# Patient Record
Sex: Female | Born: 1955 | Race: White | Hispanic: No | Marital: Married | Smoking: Former smoker
Health system: Southern US, Community
[De-identification: ages and names within clinical notes are randomized; demographics above are authoritative.]

## PROBLEM LIST (undated history)

## (undated) DIAGNOSIS — B019 Varicella without complication: Secondary | ICD-10-CM

## (undated) DIAGNOSIS — G4700 Insomnia, unspecified: Secondary | ICD-10-CM

## (undated) DIAGNOSIS — R351 Nocturia: Secondary | ICD-10-CM

## (undated) DIAGNOSIS — R350 Frequency of micturition: Secondary | ICD-10-CM

## (undated) DIAGNOSIS — G8929 Other chronic pain: Secondary | ICD-10-CM

## (undated) DIAGNOSIS — R3915 Urgency of urination: Secondary | ICD-10-CM

## (undated) DIAGNOSIS — E785 Hyperlipidemia, unspecified: Secondary | ICD-10-CM

## (undated) DIAGNOSIS — R32 Unspecified urinary incontinence: Secondary | ICD-10-CM

## (undated) DIAGNOSIS — M542 Cervicalgia: Secondary | ICD-10-CM

## (undated) DIAGNOSIS — H04123 Dry eye syndrome of bilateral lacrimal glands: Secondary | ICD-10-CM

## (undated) DIAGNOSIS — K219 Gastro-esophageal reflux disease without esophagitis: Secondary | ICD-10-CM

## (undated) DIAGNOSIS — I10 Essential (primary) hypertension: Secondary | ICD-10-CM

## (undated) DIAGNOSIS — M1990 Unspecified osteoarthritis, unspecified site: Secondary | ICD-10-CM

## (undated) DIAGNOSIS — K589 Irritable bowel syndrome without diarrhea: Secondary | ICD-10-CM

## (undated) HISTORY — PX: FOOT SURGERY: SHX648

## (undated) HISTORY — PX: TUBAL LIGATION: SHX77

## (undated) HISTORY — DX: Unspecified urinary incontinence: R32

## (undated) HISTORY — PX: ABDOMINAL HYSTERECTOMY: SHX81

## (undated) HISTORY — PX: ESOPHAGOGASTRODUODENOSCOPY: SHX1529

## (undated) HISTORY — PX: BACK SURGERY: SHX140

## (undated) HISTORY — PX: CARPAL TUNNEL RELEASE: SHX101

## (undated) HISTORY — DX: Varicella without complication: B01.9

## (undated) HISTORY — PX: COLONOSCOPY: SHX174

## (undated) HISTORY — PX: OTHER SURGICAL HISTORY: SHX169

## (undated) HISTORY — PX: LUMBAR LAMINECTOMY: SHX95

---

## 2003-04-15 LAB — HM COLONOSCOPY: HM Colonoscopy: NORMAL

## 2003-04-15 LAB — HM PAP SMEAR

## 2007-04-15 LAB — HM MAMMOGRAPHY

## 2007-10-21 ENCOUNTER — Ambulatory Visit: Payer: Self-pay | Admitting: Specialist

## 2008-09-12 ENCOUNTER — Ambulatory Visit: Payer: Self-pay | Admitting: Internal Medicine

## 2009-11-13 ENCOUNTER — Ambulatory Visit: Payer: Self-pay | Admitting: Internal Medicine

## 2011-11-26 ENCOUNTER — Ambulatory Visit: Payer: Self-pay | Admitting: Family Medicine

## 2012-02-12 ENCOUNTER — Ambulatory Visit: Payer: Self-pay

## 2012-04-14 ENCOUNTER — Encounter: Payer: Self-pay | Admitting: Internal Medicine

## 2012-04-14 ENCOUNTER — Ambulatory Visit (INDEPENDENT_AMBULATORY_CARE_PROVIDER_SITE_OTHER): Payer: BC Managed Care – PPO | Admitting: Internal Medicine

## 2012-04-14 ENCOUNTER — Other Ambulatory Visit: Payer: Self-pay | Admitting: *Deleted

## 2012-04-14 VITALS — BP 110/70 | HR 79 | Temp 98.6°F | Ht 66.0 in | Wt 162.2 lb

## 2012-04-14 DIAGNOSIS — M545 Low back pain, unspecified: Secondary | ICD-10-CM

## 2012-04-14 DIAGNOSIS — R32 Unspecified urinary incontinence: Secondary | ICD-10-CM

## 2012-04-14 DIAGNOSIS — Z0000 Encounter for general adult medical examination without abnormal findings: Secondary | ICD-10-CM

## 2012-04-14 DIAGNOSIS — M542 Cervicalgia: Secondary | ICD-10-CM

## 2012-04-14 DIAGNOSIS — G8929 Other chronic pain: Secondary | ICD-10-CM

## 2012-04-14 DIAGNOSIS — Z1239 Encounter for other screening for malignant neoplasm of breast: Secondary | ICD-10-CM

## 2012-04-14 DIAGNOSIS — E785 Hyperlipidemia, unspecified: Secondary | ICD-10-CM

## 2012-04-14 DIAGNOSIS — H6120 Impacted cerumen, unspecified ear: Secondary | ICD-10-CM

## 2012-04-14 LAB — CBC WITH DIFFERENTIAL/PLATELET
Basophils Relative: 0.7 % (ref 0.0–3.0)
Eosinophils Relative: 3.5 % (ref 0.0–5.0)
HCT: 42.8 % (ref 36.0–46.0)
Lymphs Abs: 1.7 10*3/uL (ref 0.7–4.0)
MCV: 86.2 fl (ref 78.0–100.0)
Monocytes Absolute: 0.5 10*3/uL (ref 0.1–1.0)
Neutro Abs: 4 10*3/uL (ref 1.4–7.7)
RBC: 4.97 Mil/uL (ref 3.87–5.11)
WBC: 6.4 10*3/uL (ref 4.5–10.5)

## 2012-04-14 LAB — COMPREHENSIVE METABOLIC PANEL
ALT: 18 U/L (ref 0–35)
AST: 23 U/L (ref 0–37)
Albumin: 4.3 g/dL (ref 3.5–5.2)
BUN: 13 mg/dL (ref 6–23)
CO2: 28 mEq/L (ref 19–32)
Calcium: 9.6 mg/dL (ref 8.4–10.5)
Chloride: 104 mEq/L (ref 96–112)
GFR: 82.88 mL/min (ref >60.00)
Potassium: 4.5 mEq/L (ref 3.5–5.1)

## 2012-04-14 MED ORDER — ATORVASTATIN CALCIUM 20 MG PO TABS: 20.0000 mg | ORAL_TABLET | Freq: Every day | ORAL | Status: DC

## 2012-04-14 NOTE — Assessment & Plan Note (Signed)
Symptoms of urinary incontinence and decrease sensation within the perineal region. Question if this may be secondary to nerve injury during lumbar laminectomy. Symptoms of urinary incontinence fairly well-controlled with oxybutynin. Will continue. We discussed potential referral to urogynecology in the future if symptoms are worsening.

## 2012-04-14 NOTE — Progress Notes (Signed)
Subjective:    Patient ID: Melissa Patton, female    DOB: 1956-02-06, 56 y.o.   MRN: [id number]  HPI 56YO female with h/o lumbar laminectomy x 2 and chronic low back pain presents to establish care. Her primary concern today is several week h/o neck pain and stiffness. Denies known injury.  Symptoms of pain severe in upper back/neck with radiation to bilateral arms. Notes bilateral arm numbness and tingling in hands. Occasional weakness bilateral hands. 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 Currently on medical leave from work.  Has appointment scheduled with orthopedics next week for evaluation. Notes she had a CT cervical spine in past ordered by her previous PCP, but unsure results. Has not had neurosurgical evaluation of cervical spine pain. Currently using Etodolac and Robaxin with minimal improvement in symptoms. Has also used tramadol with no improvement. Prefers not to use narcotics because of sedation.   Outpatient Encounter Prescriptions as of 04/14/2012  Medication Sig Dispense Refill  . etodolac (LODINE) 500 MG tablet Take 500 mg by mouth 2 (two) times daily.      . methocarbamol (ROBAXIN) 500 MG tablet Take 500 mg by mouth 2 (two) times daily.      . Misc Natural Products (OSTEO BI-FLEX ADV DOUBLE ST PO) Take 1 tablet by mouth daily.      Marland Kitchen OVER THE COUNTER MEDICATION I-cool Take one tablet daily      . oxybutynin (OXYTROL) 3.9 MG/24HR Place 1 patch onto the skin 2 (two) times a week.      . traMADol (ULTRAM) 50 MG tablet Take 50 mg by mouth daily.      . vitamin B-12 (CYANOCOBALAMIN) 1000 MCG tablet Take 1,000 mcg by mouth daily.      . vitamin E 400 UNIT capsule Take 400 Units by mouth daily.       BP 110/70  Pulse 79  Temp 98.6 F (37 C) (Oral)  Ht 5\' 6"  (1.676 m)  Wt 162 lb 4 oz (73.596 kg)  BMI 26.19 kg/m2  SpO2 95%  Review of Systems  Constitutional: Negative for fever, chills, appetite change, fatigue and unexpected  weight change.  HENT: Positive for neck pain. Negative for ear pain, congestion, sore throat, trouble swallowing, voice change and sinus pressure.   Eyes: Negative for visual disturbance.  Respiratory: Negative for cough, shortness of breath, wheezing and stridor.   Cardiovascular: Negative for chest pain, palpitations and leg swelling.  Gastrointestinal: Negative for nausea, vomiting, abdominal pain, diarrhea, constipation, blood in stool, abdominal distention and anal bleeding.  Genitourinary: Negative for dysuria and flank pain.  Musculoskeletal: Positive for myalgias and arthralgias. Negative for gait problem.  Skin: Negative for color change and rash.  Neurological: Positive for weakness and numbness. Negative for dizziness and headaches.  Hematological: Negative for adenopathy. Does not bruise/bleed easily.  Psychiatric/Behavioral: Positive for disturbed wake/sleep cycle. Negative for suicidal ideas and dysphoric mood. The patient is not nervous/anxious.        Objective:   Physical Exam  Constitutional: She is oriented to person, place, and time. She appears well-developed and well-nourished. No distress.  HENT:  Head: Normocephalic and atraumatic.  Right Ear: External ear normal.  Left Ear: External ear normal.  Nose: Nose normal.  Mouth/Throat: Oropharynx is clear and moist. No oropharyngeal exudate.  Eyes: Conjunctivae normal are normal. Pupils are equal, round, and reactive to light. Right eye exhibits no discharge. Left eye exhibits no discharge. No scleral icterus.  Neck: Normal range of motion. Neck supple. No tracheal deviation present. No thyromegaly present.  Cardiovascular: Normal rate, regular rhythm, normal heart sounds and intact distal pulses.  Exam reveals no gallop and no friction rub.   No murmur heard. Pulmonary/Chest: Effort normal and breath sounds normal. No respiratory distress. She has no wheezes. She has no rales. She exhibits no tenderness.    Musculoskeletal: She exhibits no edema and no tenderness.       Cervical back: She exhibits decreased range of motion, pain and spasm.       Lumbar back: She exhibits decreased range of motion and pain.  Lymphadenopathy:    She has no cervical adenopathy.  Neurological: She is alert and oriented to person, place, and time. No cranial nerve deficit. She exhibits normal muscle tone. Coordination normal.  Skin: Skin is warm and dry. No rash noted. She is not diaphoretic. No erythema. No pallor.  Psychiatric: She has a normal mood and affect. Her behavior is normal. Judgment and thought content normal.          Assessment & Plan:

## 2012-04-14 NOTE — Assessment & Plan Note (Signed)
Bilateral cerumen impaction. Cerumen removed today with warm water lavage. Follow up prn.

## 2012-04-14 NOTE — Assessment & Plan Note (Signed)
Severe upper back pain with bilateral arm weakness and paresthesias. Patient has had CT of the cervical spine for evaluation. Will request records on this. She has followup with orthopedics next week. She will continue nonsteroidal anti-inflammatory medication and muscle relaxants. We discussed addition of narcotic medicine for better pain control but she would prefer to hold off on this. Followup one month.

## 2012-04-14 NOTE — Assessment & Plan Note (Signed)
Cholesterol markedly elevated on labs today. Recommended starting Lipitor 20mg  daily and repeat lipids and LFTs in 1 month.

## 2012-04-14 NOTE — Assessment & Plan Note (Signed)
Chronic low back pain status post lumbar laminectomy x2. Symptoms of low back pain fairly well-controlled with etodolac and Robaxin. Will continue.

## 2012-04-26 ENCOUNTER — Ambulatory Visit: Payer: Self-pay | Admitting: Specialist

## 2012-05-06 ENCOUNTER — Other Ambulatory Visit: Payer: Self-pay | Admitting: Neurosurgery

## 2012-05-07 ENCOUNTER — Encounter (HOSPITAL_COMMUNITY): Payer: Self-pay | Admitting: Pharmacy Technician

## 2012-05-12 ENCOUNTER — Encounter (HOSPITAL_COMMUNITY)
Admission: RE | Admit: 2012-05-12 | Discharge: 2012-05-12 | Disposition: A | Discharge status: Home / Self Care | Payer: BC Managed Care – PPO | Source: Ambulatory Visit | Attending: Neurosurgery | Admitting: Neurosurgery

## 2012-05-12 ENCOUNTER — Encounter (HOSPITAL_COMMUNITY): Payer: Self-pay

## 2012-05-12 HISTORY — DX: Dry eye syndrome of bilateral lacrimal glands: H04.123

## 2012-05-12 HISTORY — DX: Insomnia, unspecified: G47.00

## 2012-05-12 HISTORY — DX: Nocturia: R35.1

## 2012-05-12 HISTORY — DX: Irritable bowel syndrome, unspecified: K58.9

## 2012-05-12 HISTORY — DX: Urgency of urination: R39.15

## 2012-05-12 HISTORY — DX: Unspecified osteoarthritis, unspecified site: M19.90

## 2012-05-12 HISTORY — DX: Cervicalgia: M54.2

## 2012-05-12 HISTORY — DX: Frequency of micturition: R35.0

## 2012-05-12 HISTORY — DX: Other chronic pain: G89.29

## 2012-05-12 HISTORY — DX: Hyperlipidemia, unspecified: E78.5

## 2012-05-12 LAB — BASIC METABOLIC PANEL
Calcium: 9.8 mg/dL (ref 8.4–10.5)
GFR calc Af Amer: >90 mL/min (ref >90)
GFR calc non Af Amer: >90 mL/min (ref >90)
Potassium: 4.2 mEq/L (ref 3.5–5.1)
Sodium: 139 mEq/L (ref 135–145)

## 2012-05-12 LAB — SURGICAL PCR SCREEN
MRSA, PCR: NEGATIVE
Staphylococcus aureus: NEGATIVE

## 2012-05-12 LAB — CBC
Hemoglobin: 13.6 g/dL (ref 12.0–15.0)
MCH: 28.4 pg (ref 26.0–34.0)
Platelets: 307 10*3/uL (ref 150–400)
RBC: 4.79 MIL/uL (ref 3.87–5.11)
WBC: 6.8 10*3/uL (ref 4.0–10.5)

## 2012-05-12 NOTE — Progress Notes (Signed)
Pt doesn't have a cardiologist   Denies ever having a heart cath/echo/stress test  Dr.[name] with [facility] [address] is Medical Md  Denies ekg or cxr within last yr

## 2012-05-12 NOTE — Pre-Procedure Instructions (Signed)
20 ROMONA MURDY  05/12/2012   Your procedure is scheduled on:  Mon, Nov 11 @ 11:08 AM  Report to [facility] at 9:00 AM.  Call this number if you have problems the morning of surgery: [phone]   Remember:   Do not eat food:After Midnight.    Take these medicines the morning of surgery with A SIP OF WATER: Tramadol(Ultram)   Do not wear jewelry, make-up or nail polish.  Do not wear lotions, powders, or perfumes. You may wear deodorant.  Do not shave 48 hours prior to surgery.   Do not bring valuables to the hospital.  Contacts, dentures or bridgework may not be worn into surgery.  Leave suitcase in the car. After surgery it may be brought to your room.  For patients admitted to the hospital, checkout time is 11:00 AM the day of discharge.   Patients discharged the day of surgery will not be allowed to drive home.    Special Instructions: Shower using CHG 2 nights before surgery and the night before surgery.  If you shower the day of surgery use CHG.  Use special wash - you have one bottle of CHG for all showers.  You should use approximately 1/3 of the bottle for each shower.   Please read over the following fact sheets that you were given: Pain Booklet, Coughing and Deep Breathing, MRSA Information and Surgical Site Infection Prevention

## 2012-05-16 MED ORDER — CEFAZOLIN SODIUM-DEXTROSE 2-3 GM-% IV SOLR
2.0000 g | INTRAVENOUS | Status: AC
  Administered 2012-05-17: 2 g via INTRAVENOUS
  Filled 2012-05-16: qty 50

## 2012-05-16 MED ORDER — DEXAMETHASONE SODIUM PHOSPHATE 10 MG/ML IJ SOLN
10.0000 mg | INTRAMUSCULAR | Status: AC
  Administered 2012-05-17: 10 mg via INTRAVENOUS
  Filled 2012-05-16: qty 1

## 2012-05-17 ENCOUNTER — Encounter (HOSPITAL_COMMUNITY): Payer: Self-pay | Admitting: *Deleted

## 2012-05-17 ENCOUNTER — Ambulatory Visit (HOSPITAL_COMMUNITY): Payer: BC Managed Care – PPO

## 2012-05-17 ENCOUNTER — Encounter (HOSPITAL_COMMUNITY)
Admission: RE | Disposition: A | Discharge status: Home / Self Care | Payer: Self-pay | Source: Ambulatory Visit | Attending: Neurosurgery

## 2012-05-17 ENCOUNTER — Ambulatory Visit (HOSPITAL_COMMUNITY): Payer: BC Managed Care – PPO | Admitting: *Deleted

## 2012-05-17 ENCOUNTER — Ambulatory Visit (HOSPITAL_COMMUNITY)
Admission: RE | Admit: 2012-05-17 | Discharge: 2012-05-18 | Disposition: A | Discharge status: Home / Self Care | Payer: BC Managed Care – PPO | Source: Ambulatory Visit | Attending: Neurosurgery | Admitting: Neurosurgery

## 2012-05-17 DIAGNOSIS — E785 Hyperlipidemia, unspecified: Secondary | ICD-10-CM | POA: Insufficient documentation

## 2012-05-17 DIAGNOSIS — Z01812 Encounter for preprocedural laboratory examination: Secondary | ICD-10-CM | POA: Insufficient documentation

## 2012-05-17 DIAGNOSIS — M5020 Other cervical disc displacement, unspecified cervical region: Secondary | ICD-10-CM | POA: Insufficient documentation

## 2012-05-17 DIAGNOSIS — M129 Arthropathy, unspecified: Secondary | ICD-10-CM | POA: Insufficient documentation

## 2012-05-17 DIAGNOSIS — M47812 Spondylosis without myelopathy or radiculopathy, cervical region: Secondary | ICD-10-CM | POA: Insufficient documentation

## 2012-05-17 DIAGNOSIS — M542 Cervicalgia: Secondary | ICD-10-CM

## 2012-05-17 DIAGNOSIS — G8929 Other chronic pain: Secondary | ICD-10-CM

## 2012-05-17 HISTORY — PX: ANTERIOR CERVICAL DECOMP/DISCECTOMY FUSION: SHX1161

## 2012-05-17 SURGERY — ANTERIOR CERVICAL DECOMPRESSION/DISCECTOMY FUSION 3 LEVELS
Anesthesia: General | Site: Spine Cervical | Laterality: Bilateral | Wound class: Clean

## 2012-05-17 MED ORDER — OXYCODONE-ACETAMINOPHEN 5-325 MG PO TABS
1.0000 | ORAL_TABLET | ORAL | Status: DC | PRN
  Administered 2012-05-18: 2 via ORAL
  Filled 2012-05-17: qty 2

## 2012-05-17 MED ORDER — ONDANSETRON HCL 4 MG/2ML IJ SOLN: 4.0000 mg | INTRAMUSCULAR | Status: DC | PRN

## 2012-05-17 MED ORDER — SODIUM CHLORIDE 0.9 % IV SOLN: 250.0000 mL | INTRAVENOUS | Status: DC

## 2012-05-17 MED ORDER — THROMBIN 5000 UNITS EX SOLR
OROMUCOSAL | Status: DC | PRN
  Administered 2012-05-17: 12:00:00 via TOPICAL

## 2012-05-17 MED ORDER — CEFAZOLIN SODIUM 1-5 GM-% IV SOLN
1.0000 g | Freq: Three times a day (TID) | INTRAVENOUS | Status: AC
  Administered 2012-05-17 – 2012-05-18 (×2): 1 g via INTRAVENOUS
  Filled 2012-05-17 (×2): qty 50

## 2012-05-17 MED ORDER — SODIUM CHLORIDE 0.9 % IJ SOLN: 3.0000 mL | INTRAMUSCULAR | Status: DC | PRN

## 2012-05-17 MED ORDER — MEPERIDINE HCL 25 MG/ML IJ SOLN: 6.2500 mg | INTRAMUSCULAR | Status: DC | PRN

## 2012-05-17 MED ORDER — ACETAMINOPHEN 10 MG/ML IV SOLN
INTRAVENOUS | Status: AC
  Administered 2012-05-17: 1000 mg via INTRAVENOUS
  Filled 2012-05-17: qty 100

## 2012-05-17 MED ORDER — OXYCODONE HCL 5 MG/5ML PO SOLN: 5.0000 mg | Freq: Once | ORAL | Status: DC | PRN

## 2012-05-17 MED ORDER — OXYCODONE HCL 5 MG PO TABS: 5.0000 mg | ORAL_TABLET | Freq: Once | ORAL | Status: DC | PRN

## 2012-05-17 MED ORDER — THROMBIN 20000 UNITS EX SOLR
CUTANEOUS | Status: DC | PRN
  Administered 2012-05-17: 12:00:00 via TOPICAL

## 2012-05-17 MED ORDER — HYDROMORPHONE HCL PF 1 MG/ML IJ SOLN
0.2500 mg | INTRAMUSCULAR | Status: DC | PRN
  Administered 2012-05-17 (×2): 0.5 mg via INTRAVENOUS

## 2012-05-17 MED ORDER — HYDROMORPHONE HCL PF 1 MG/ML IJ SOLN: 0.5000 mg | INTRAMUSCULAR | Status: DC | PRN

## 2012-05-17 MED ORDER — GLYCOPYRROLATE 0.2 MG/ML IJ SOLN
INTRAMUSCULAR | Status: DC | PRN
  Administered 2012-05-17: .5 mg via INTRAVENOUS

## 2012-05-17 MED ORDER — FENTANYL CITRATE 0.05 MG/ML IJ SOLN
INTRAMUSCULAR | Status: DC | PRN
  Administered 2012-05-17 (×2): 100 ug via INTRAVENOUS
  Administered 2012-05-17 (×2): 50 ug via INTRAVENOUS
  Administered 2012-05-17: 100 ug via INTRAVENOUS
  Administered 2012-05-17 (×4): 50 ug via INTRAVENOUS

## 2012-05-17 MED ORDER — SODIUM CHLORIDE 0.9 % IJ SOLN
3.0000 mL | Freq: Two times a day (BID) | INTRAMUSCULAR | Status: DC
  Administered 2012-05-17: 3 mL via INTRAVENOUS

## 2012-05-17 MED ORDER — PROPOFOL 10 MG/ML IV BOLUS
INTRAVENOUS | Status: DC | PRN
  Administered 2012-05-17: 150 mg via INTRAVENOUS

## 2012-05-17 MED ORDER — ATORVASTATIN CALCIUM 20 MG PO TABS
20.0000 mg | ORAL_TABLET | Freq: Every day | ORAL | Status: DC
  Administered 2012-05-17: 20 mg via ORAL
  Filled 2012-05-17 (×2): qty 1

## 2012-05-17 MED ORDER — PROMETHAZINE HCL 25 MG/ML IJ SOLN: 6.2500 mg | INTRAMUSCULAR | Status: DC | PRN

## 2012-05-17 MED ORDER — LACTATED RINGERS IV SOLN
INTRAVENOUS | Status: DC | PRN
  Administered 2012-05-17 (×3): via INTRAVENOUS

## 2012-05-17 MED ORDER — BACITRACIN 50000 UNITS IM SOLR
INTRAMUSCULAR | Status: AC
  Filled 2012-05-17: qty 1

## 2012-05-17 MED ORDER — HYPROMELLOSE (GONIOSCOPIC) 2.5 % OP SOLN
1.0000 [drp] | OPHTHALMIC | Status: DC | PRN
  Filled 2012-05-17: qty 15

## 2012-05-17 MED ORDER — MENTHOL 3 MG MT LOZG
1.0000 | LOZENGE | OROMUCOSAL | Status: DC | PRN
  Filled 2012-05-17: qty 9

## 2012-05-17 MED ORDER — PROMETHAZINE HCL 25 MG/ML IJ SOLN
12.5000 mg | Freq: Four times a day (QID) | INTRAMUSCULAR | Status: DC | PRN
  Administered 2012-05-17: 12.5 mg via INTRAVENOUS
  Filled 2012-05-17: qty 1

## 2012-05-17 MED ORDER — ACETAMINOPHEN 650 MG RE SUPP: 650.0000 mg | RECTAL | Status: DC | PRN

## 2012-05-17 MED ORDER — ONDANSETRON HCL 4 MG/2ML IJ SOLN
INTRAMUSCULAR | Status: DC | PRN
  Administered 2012-05-17: 4 mg via INTRAVENOUS

## 2012-05-17 MED ORDER — LIDOCAINE HCL (CARDIAC) 20 MG/ML IV SOLN
INTRAVENOUS | Status: DC | PRN
  Administered 2012-05-17: 80 mg via INTRAVENOUS

## 2012-05-17 MED ORDER — KETOROLAC TROMETHAMINE 30 MG/ML IJ SOLN
15.0000 mg | Freq: Once | INTRAMUSCULAR | Status: AC | PRN
  Administered 2012-05-17: 30 mg via INTRAVENOUS

## 2012-05-17 MED ORDER — CYCLOBENZAPRINE HCL 10 MG PO TABS: 10.0000 mg | ORAL_TABLET | Freq: Three times a day (TID) | ORAL | Status: DC | PRN

## 2012-05-17 MED ORDER — DOCUSATE SODIUM 100 MG PO CAPS
100.0000 mg | ORAL_CAPSULE | Freq: Two times a day (BID) | ORAL | Status: DC
  Administered 2012-05-17: 100 mg via ORAL
  Filled 2012-05-17: qty 1

## 2012-05-17 MED ORDER — ONDANSETRON HCL 4 MG/2ML IJ SOLN
INTRAMUSCULAR | Status: AC
  Administered 2012-05-17: 4 mg
  Filled 2012-05-17: qty 2

## 2012-05-17 MED ORDER — METHOCARBAMOL 500 MG PO TABS
500.0000 mg | ORAL_TABLET | Freq: Three times a day (TID) | ORAL | Status: DC
  Administered 2012-05-17: 500 mg via ORAL
  Filled 2012-05-17 (×4): qty 1

## 2012-05-17 MED ORDER — PHENYLEPHRINE HCL 10 MG/ML IJ SOLN
10.0000 mg | INTRAVENOUS | Status: DC | PRN
  Administered 2012-05-17: 20 ug/min via INTRAVENOUS

## 2012-05-17 MED ORDER — SODIUM CHLORIDE 0.9 % IV SOLN
INTRAVENOUS | Status: AC
  Filled 2012-05-17: qty 500

## 2012-05-17 MED ORDER — KETOROLAC TROMETHAMINE 30 MG/ML IJ SOLN
INTRAMUSCULAR | Status: AC
  Filled 2012-05-17: qty 1

## 2012-05-17 MED ORDER — TRAMADOL HCL 50 MG PO TABS
50.0000 mg | ORAL_TABLET | Freq: Four times a day (QID) | ORAL | Status: DC
  Filled 2012-05-17 (×3): qty 1

## 2012-05-17 MED ORDER — PHENOL 1.4 % MT LIQD: 1.0000 | OROMUCOSAL | Status: DC | PRN

## 2012-05-17 MED ORDER — MIDAZOLAM HCL 5 MG/5ML IJ SOLN
INTRAMUSCULAR | Status: DC | PRN
  Administered 2012-05-17 (×2): 1 mg via INTRAVENOUS

## 2012-05-17 MED ORDER — TRAMADOL HCL 50 MG PO TABS
50.0000 mg | ORAL_TABLET | Freq: Four times a day (QID) | ORAL | Status: DC | PRN
  Filled 2012-05-17: qty 1

## 2012-05-17 MED ORDER — NEOSTIGMINE METHYLSULFATE 1 MG/ML IJ SOLN
INTRAMUSCULAR | Status: DC | PRN
  Administered 2012-05-17: 3 mg via INTRAVENOUS

## 2012-05-17 MED ORDER — ACETAMINOPHEN 325 MG PO TABS: 650.0000 mg | ORAL_TABLET | ORAL | Status: DC | PRN

## 2012-05-17 MED ORDER — 0.9 % SODIUM CHLORIDE (POUR BTL) OPTIME
TOPICAL | Status: DC | PRN
  Administered 2012-05-17: 1000 mL

## 2012-05-17 MED ORDER — HYDROMORPHONE HCL PF 1 MG/ML IJ SOLN
INTRAMUSCULAR | Status: AC
  Filled 2012-05-17: qty 1

## 2012-05-17 MED ORDER — ROCURONIUM BROMIDE 100 MG/10ML IV SOLN
INTRAVENOUS | Status: DC | PRN
  Administered 2012-05-17: 50 mg via INTRAVENOUS
  Administered 2012-05-17: 10 mg via INTRAVENOUS

## 2012-05-17 MED ORDER — SODIUM CHLORIDE 0.9 % IR SOLN
Status: DC | PRN
  Administered 2012-05-17: 12:00:00

## 2012-05-17 SURGICAL SUPPLY — 69 items
ADH SKN CLS APL DERMABOND .7 (GAUZE/BANDAGES/DRESSINGS) ×1
APL SKNCLS STERI-STRIP NONHPOA (GAUZE/BANDAGES/DRESSINGS) ×1
BAG DECANTER FOR FLEXI CONT (MISCELLANEOUS) ×2 IMPLANT
BENZOIN TINCTURE PRP APPL 2/3 (GAUZE/BANDAGES/DRESSINGS) ×2 IMPLANT
BRUSH SCRUB EZ PLAIN DRY (MISCELLANEOUS) ×2 IMPLANT
BUR MATCHSTICK NEURO 3.0 LAGG (BURR) ×2 IMPLANT
CANISTER SUCTION 2500CC (MISCELLANEOUS) ×2 IMPLANT
CLOTH BEACON ORANGE TIMEOUT ST (SAFETY) ×2 IMPLANT
CONT SPEC 4OZ CLIKSEAL STRL BL (MISCELLANEOUS) ×2 IMPLANT
DECANTER SPIKE VIAL GLASS SM (MISCELLANEOUS) ×2 IMPLANT
DERMABOND ADVANCED (GAUZE/BANDAGES/DRESSINGS) ×1
DERMABOND ADVANCED .7 DNX12 (GAUZE/BANDAGES/DRESSINGS) ×1 IMPLANT
DRAIN SNY WOU 7FLT (WOUND CARE) ×1 IMPLANT
DRAPE C-ARM 42X72 X-RAY (DRAPES) ×4 IMPLANT
DRAPE LAPAROTOMY 100X72 PEDS (DRAPES) ×2 IMPLANT
DRAPE MICROSCOPE LEICA (MISCELLANEOUS) ×1 IMPLANT
DRAPE MICROSCOPE ZEISS OPMI (DRAPES) ×1 IMPLANT
DRAPE POUCH INSTRU U-SHP 10X18 (DRAPES) ×2 IMPLANT
DRSG OPSITE 4X5.5 SM (GAUZE/BANDAGES/DRESSINGS) ×2 IMPLANT
ELECT COATED BLADE 2.86 ST (ELECTRODE) ×2 IMPLANT
ELECT REM PT RETURN 9FT ADLT (ELECTROSURGICAL) ×2
ELECTRODE REM PT RTRN 9FT ADLT (ELECTROSURGICAL) ×1 IMPLANT
EVACUATOR SILICONE 100CC (DRAIN) ×1 IMPLANT
GAUZE SPONGE 4X4 16PLY XRAY LF (GAUZE/BANDAGES/DRESSINGS) IMPLANT
GLOVE BIO SURGEON STRL SZ8 (GLOVE) ×2 IMPLANT
GLOVE BIOGEL PI IND STRL 7.5 (GLOVE) IMPLANT
GLOVE BIOGEL PI IND STRL 8 (GLOVE) IMPLANT
GLOVE BIOGEL PI IND STRL 8.5 (GLOVE) IMPLANT
GLOVE BIOGEL PI INDICATOR 7.5 (GLOVE) ×1
GLOVE BIOGEL PI INDICATOR 8 (GLOVE) ×1
GLOVE BIOGEL PI INDICATOR 8.5 (GLOVE) ×1
GLOVE ECLIPSE 7.5 STRL STRAW (GLOVE) ×2 IMPLANT
GLOVE EXAM NITRILE LRG STRL (GLOVE) IMPLANT
GLOVE EXAM NITRILE MD LF STRL (GLOVE) ×2 IMPLANT
GLOVE EXAM NITRILE XL STR (GLOVE) IMPLANT
GLOVE EXAM NITRILE XS STR PU (GLOVE) IMPLANT
GLOVE INDICATOR 8.5 STRL (GLOVE) ×2 IMPLANT
GLOVE SURG SS PI 8.0 STRL IVOR (GLOVE) ×3 IMPLANT
GOWN BRE IMP SLV AUR LG STRL (GOWN DISPOSABLE) IMPLANT
GOWN BRE IMP SLV AUR XL STRL (GOWN DISPOSABLE) ×4 IMPLANT
GOWN STRL REIN 2XL LVL4 (GOWN DISPOSABLE) ×1 IMPLANT
HEAD HALTER (SOFTGOODS) ×2 IMPLANT
HEMOSTAT POWDER KIT SURGIFOAM (HEMOSTASIS) IMPLANT
KIT BASIN OR (CUSTOM PROCEDURE TRAY) ×2 IMPLANT
KIT ROOM TURNOVER OR (KITS) ×2 IMPLANT
NDL SPNL 20GX3.5 QUINCKE YW (NEEDLE) ×1 IMPLANT
NEEDLE SPNL 20GX3.5 QUINCKE YW (NEEDLE) ×2 IMPLANT
NS IRRIG 1000ML POUR BTL (IV SOLUTION) ×2 IMPLANT
PACK LAMINECTOMY NEURO (CUSTOM PROCEDURE TRAY) ×2 IMPLANT
PLATE ANT CERVICAL 52.5 (Plate) ×1 IMPLANT
PUTTY BONE DBX 2.5 MIS (Bone Implant) ×1 IMPLANT
RUBBERBAND STERILE (MISCELLANEOUS) ×4 IMPLANT
SCREW ST 13X4XST VA NS SPNE (Screw) IMPLANT
SCREW ST FIX 4 ATL 3120213 (Screw) ×6 IMPLANT
SCREW ST VAR 4 ATL (Screw) ×4 IMPLANT
SPACER COLONIAL 6X14X12 (Spacer) ×1 IMPLANT
SPACER COLONIAL 7X14X12 (Spacer) ×2 IMPLANT
SPONGE GAUZE 4X4 12PLY (GAUZE/BANDAGES/DRESSINGS) ×2 IMPLANT
SPONGE INTESTINAL PEANUT (DISPOSABLE) ×2 IMPLANT
SPONGE SURGIFOAM ABS GEL 100 (HEMOSTASIS) ×2 IMPLANT
STRIP CLOSURE SKIN 1/2X4 (GAUZE/BANDAGES/DRESSINGS) ×3 IMPLANT
SUT VIC AB 3-0 SH 8-18 (SUTURE) ×2 IMPLANT
SUT VICRYL 4-0 PS2 18IN ABS (SUTURE) ×2 IMPLANT
SYR 20ML ECCENTRIC (SYRINGE) ×2 IMPLANT
TAPE CLOTH 4X10 WHT NS (GAUZE/BANDAGES/DRESSINGS) IMPLANT
TOWEL OR 17X24 6PK STRL BLUE (TOWEL DISPOSABLE) ×2 IMPLANT
TOWEL OR 17X26 10 PK STRL BLUE (TOWEL DISPOSABLE) ×2 IMPLANT
TRAP SPECIMEN MUCOUS 40CC (MISCELLANEOUS) ×2 IMPLANT
WATER STERILE IRR 1000ML POUR (IV SOLUTION) ×2 IMPLANT

## 2012-05-17 NOTE — Progress Notes (Signed)
Dr. Wynetta Emery at bedside will redress surgical site do to JP not draining dressing is bloody

## 2012-05-17 NOTE — Anesthesia Postprocedure Evaluation (Signed)
  Anesthesia Post-op Note  Patient: Melissa Patton  Procedure(s) Performed: Procedure(s) (LRB) with comments: ANTERIOR CERVICAL DECOMPRESSION/DISCECTOMY FUSION 3 LEVELS (Bilateral) - Cervical three-four, four-five, five-six anterior cervical discectomy with fusion  Patient Location: PACU  Anesthesia Type:General  Level of Consciousness: awake and sedated  Airway and Oxygen Therapy: Patient Spontanous Breathing  Post-op Pain: mild  Post-op Assessment: Post-op Vital signs reviewed  Post-op Vital Signs: stable  Complications: No apparent anesthesia complications

## 2012-05-17 NOTE — Preoperative (Signed)
Beta Blockers   Reason not to administer Beta Blockers:Not Applicable 

## 2012-05-17 NOTE — Anesthesia Preprocedure Evaluation (Addendum)
Anesthesia Evaluation  Patient identified by MRN, date of birth, ID band Patient awake    Reviewed: Allergy & Precautions, H&P , NPO status , Patient's Chart, lab work & pertinent test results  Airway Mallampati: II  Neck ROM: Full    Dental  (+) Edentulous Upper, Edentulous Lower and Dental Advisory Given   Pulmonary neg pulmonary ROS,  breath sounds clear to auscultation        Cardiovascular negative cardio ROS  Rhythm:Regular Rate:Normal     Neuro/Psych negative neurological ROS     GI/Hepatic negative GI ROS, Neg liver ROS,   Endo/Other  negative endocrine ROS  Renal/GU negative Renal ROS     Musculoskeletal negative musculoskeletal ROS (+)   Abdominal   Peds  Hematology negative hematology ROS (+)   Anesthesia Other Findings   Reproductive/Obstetrics                          Anesthesia Physical Anesthesia Plan  ASA: I  Anesthesia Plan: General   Post-op Pain Management:    Induction: Intravenous  Airway Management Planned: Oral ETT  Additional Equipment:   Intra-op Plan:   Post-operative Plan: Extubation in OR  Informed Consent: I have reviewed the patients History and Physical, chart, labs and discussed the procedure including the risks, benefits and alternatives for the proposed anesthesia with the patient or authorized representative who has indicated his/her understanding and acceptance.   Dental advisory given  Plan Discussed with: CRNA and Surgeon  Anesthesia Plan Comments:         Anesthesia Quick Evaluation

## 2012-05-17 NOTE — Plan of Care (Signed)
Problem: Consults Goal: Diagnosis - Spinal Surgery Outcome: Completed/Met Date Met:  05/17/12 Cervical Spine Fusion

## 2012-05-17 NOTE — Progress Notes (Signed)
Dr. Cram at bedside 

## 2012-05-17 NOTE — Transfer of Care (Signed)
Immediate Anesthesia Transfer of Care Note  Patient: Melissa Patton  Procedure(s) Performed: Procedure(s) (LRB) with comments: ANTERIOR CERVICAL DECOMPRESSION/DISCECTOMY FUSION 3 LEVELS (Bilateral) - Cervical three-four, four-five, five-six anterior cervical discectomy with fusion  Patient Location: PACU  Anesthesia Type:General  Level of Consciousness: sedated  Airway & Oxygen Therapy: Patient Spontanous Breathing and Patient connected to nasal cannula oxygen  Post-op Assessment: Report given to PACU RN and Post -op Vital signs reviewed and stable  Post vital signs: Reviewed and stable  Complications: No apparent anesthesia complications

## 2012-05-17 NOTE — H&P (Signed)
Melissa Patton is an 56 y.o. female.   Chief Complaint: Neck and left shoulder and arm pain HPI: Patient is a pleasant 63 to female is a progress worsening neck and left shoulder and arm pain since a on-the-job injury where she was pulling against heavy door the caught she was worked up extensively initially for shoulder problems ultimately underwent MRI scan of her neck was submitted which showed severe spinal cord compression spondylosis and cervical stenosis at C3-4, C4-5, C5-6. Patient weakness on her clinical exam and due to progression of clinical syndrome and imaging findings and takes her treatment she was recommended anterior cervical discectomies and fusion at C3-4, C4-5, C5-6. I extensively reviewed the risks and benefits of the operation as well as perioperative course and expectations of outcome alternatives of surgery she understands and agrees to proceed forward.  Past Medical History  Diagnosis Date  . Chicken pox   . Urinary incontinence   . Hyperlipidemia     takes Atorvastatin daily  . Arthritis   . Chronic neck pain     DDD/spondylosis/stenosis  . IBS (irritable bowel syndrome)   . Urinary frequency   . Urinary urgency   . Nocturia   . Dry eyes     dry eyes d/t use of contacts  . Insomnia     Past Surgical History  Procedure Date  . Carpal tunnel release   . Hammer toe   . Abdominal hysterectomy     30 years ago  . Lumbar laminectomy 1989, 1991    [facility], Dr. Elesa Hacker  . Vaginal delivery   . Foot surgery     d/t hammer toe-bil  . Tubal ligation   . Back surgery     x 2  . Back pain     hx of buldging disc  . Colonoscopy   . Esophagogastroduodenoscopy     Family History  Problem Relation Age of Onset  . Heart disease Mother   . Diabetes Mother   . Heart disease Father   . Diabetes Father   . Diabetes Brother   . Depression Brother    Social History:  reports that she has quit smoking. She does not have any smokeless tobacco history on file.  She reports that she does not drink alcohol or use illicit drugs.  Allergies: No Known Allergies  Medications Prior to Admission  Medication Sig Dispense Refill  . atorvastatin (LIPITOR) 20 MG tablet Take 1 tablet (20 mg total) by mouth daily.  30 tablet  1  . etodolac (LODINE) 500 MG tablet Take 500 mg by mouth 2 (two) times daily as needed. For inflammation      . hydroxypropyl methylcellulose (ISOPTO TEARS) 2.5 % ophthalmic solution Place 1 drop into both eyes daily as needed. For dry eyes      . methocarbamol (ROBAXIN) 500 MG tablet Take 500 mg by mouth 2 (two) times daily as needed. For muscle spasms      . traMADol (ULTRAM) 50 MG tablet Take 50 mg by mouth 2 (two) times daily as needed. For muscle        No results found for this or any previous visit (from the past 48 hour(s)). No results found.  Review of Systems  Constitutional: Negative.   HENT: Positive for neck pain.   Eyes: Negative.   Respiratory: Negative.   Cardiovascular: Negative.   Genitourinary: Negative.   Musculoskeletal: Positive for joint pain.  Skin: Negative.   Neurological: Positive for tingling and sensory change.  Endo/Heme/Allergies: Negative.   Psychiatric/Behavioral: Negative.     Blood pressure 134/80, pulse 83, temperature 98.2 F (36.8 C), temperature source Oral, resp. rate 20, SpO2 98.00%. Physical Exam  Constitutional: She is oriented to person, place, and time. She appears well-developed and well-nourished.  HENT:  Head: Normocephalic.  Neck: Normal range of motion.  Cardiovascular: Normal rate.   Respiratory: Effort normal.  Musculoskeletal: Normal range of motion.  Neurological: She is alert and oriented to person, place, and time. She displays a negative Romberg sign. GCS eye subscore is 4. GCS verbal subscore is 5. GCS motor subscore is 6.  Reflex Scores:      Tricep reflexes are 2+ on the right side and 2+ on the left side.      Bicep reflexes are 2+ on the right side and 2+ on  the left side.      Brachioradialis reflexes are 2+ on the right side and 2+ on the left side.      Patellar reflexes are 2+ on the right side and 2+ on the left side.      Achilles reflexes are 2+ on the right side and 2+ on the left side.      Patient has a bilateral weakness of her triceps and biceps at 4+ out of 5 she is otherwise 5 out of 5 in her deltoids wrist flexion extension and intrinsics and grip strength. Lower extremity strength is also 5 out of 5     Assessment/Plan 56 year female presents for an ACDF at C3-4, C4-5, C5-6.  Mostafa Yuan P 05/17/2012, 10:38 AM

## 2012-05-17 NOTE — Op Note (Signed)
Preoperative diagnosis: Cervical stenosis from severe spondylosis with ruptured disc at C3-4, C4-5, C5-6 to  Postoperative diagnosis: Same  Procedure: Anterior cervical discectomies and fusion at C3-4, C4-5, C5-6. Using globus peek cages packed with local autograft mixed with DBX and the Atlantis translational plate at the 5 mm with 6 fixed angle 13 mm screws and 2 rescue screws  Surgeon: Jillyn Hidden Daizee Firmin  Assistant: Shirlean Kelly  Anesthesia: Gen.  EBL: Minimal  History of present illness: Patient is a 36 to female who sustained a work-related injury when she was pulling on a large heavy door that caught and jerked her hand she experienced immediate neck and left shoulder and arm pain with numbness tingling her hands workup revealed severe cervical spine spondylosis with kyphosis and stenosis as well as herniated nucleus pulposus causing spinal cord compression probably at C4-5 but also at C3-4 and C5-6. Due to patient's status her treatment progression clinical syndrome and imaging findings have recommended anterior cervical discectomy and fusion at C3-4, C4-5, C5-6. X-rays reviewed the risks benefits of the operation as well as therapy course expectations of outcome alternatives of surgery she understands and agrees to proceed forward.  Operative procedure: Patient was brought into the or was induced under general anesthesia positioned supine the neck in slight extension in 5 pounds of halter traction the right side and it was prepped and draped in routine sterile fashion preoperative x-ray x-ray localize the appropriate levels after a curvilinear incision was made just off midline to the head were sternomastoid and the superficial layer of the platysma was dissected and divided longitudinally the avascular plane to sternomastoid and strap as was developed and the prevertebral fascia was dissected away with Kitners. Interoperative X. identify the appropriate level so a Is were made in all 3 disc  spaces of the lungs close and reflected laterally and self-retaining retractor was placed. Large anterior aspect of did not flexor rongeur to 20 minute Kerrison punch first working at C4-5 and C5-6 he displays fairly markedly collapsed and degenerated and spondylitic after the anterior aspect removed the spaces were drilled down capturing the bone shavings in a mucous trap exclamation first working at C5-6 further drilling down drilled off the posterior ossified PLL was identified and removed in piecemeal fashion aggressive abutting both endplates and the right-sided spinal canal all identification of the dura and the nose a large spur coming off the C5 vertebral body causing severe spinal cord and thecal sac compression and left-sided teased off of the dura gently with a 1 and 2 mm Kerrison punch. There is some acute disc herniation at this level also this was teased within her foot and aggressive abutting both endplates a decompressive canal March across laterally to both C6 pedicles were identified both C6 nerve roots were skeletonized flush with pedicle. After adequate central and foraminal decompression achieved this packed with Gelfoam to second C4-5 and a similar fashion C4-5 was drilled down again large recurrent disc was found as well as a large spondylitic spur displacing the spinal cord this is all removed decompress the central canal and both C5 medical pedicle were identified and both C5 nerve roots were skeletonized flush with pedicle. Then both implants were inserted at these levels 6 mm at C4-5-1 7 mm C5-6 and then attention was taken at C3-4. A similar fashion C3-4 was drilled down capturing the bone shavings in a mucous trap aggressive abutting both endplates removed a large spur coming off the C4 vertebral body as well as the C3 decompress  the central canal to the level of the C4 pedicle. After adequate central and foraminal decompression achieved this was packed with a size 7 cage packed with  local are graft mixed DBX then the Atlantis translational plate was sized a 35 mm plate was placed all screws excellent purchase locking mechanisms were engaged and was in to see her good fixing space was maintained a drain was placed the wounds closed in layers with after Vicryl and the skin was closed with a running 4 subcuticular benzo and Steri-Strips were applied patient recovered in stable condition. At the end of case all needle counts and sponge counts were correct.

## 2012-05-18 ENCOUNTER — Encounter (HOSPITAL_COMMUNITY): Payer: Self-pay | Admitting: Neurosurgery

## 2012-05-18 MED ORDER — CYCLOBENZAPRINE HCL 10 MG PO TABS: 10.0000 mg | ORAL_TABLET | Freq: Three times a day (TID) | ORAL | Status: DC | PRN

## 2012-05-18 MED ORDER — OXYCODONE-ACETAMINOPHEN 5-325 MG PO TABS: 1.0000 | ORAL_TABLET | ORAL | Status: DC | PRN

## 2012-05-18 NOTE — Progress Notes (Signed)
Subjective: Patient reports C7 a lot better arms and hands feel significantly improved throat is sore but it is also improving to  Objective: Vital signs in last 24 hours: Temp:  [97.9 F (36.6 C)-98.4 F (36.9 C)] 97.9 F (36.6 C) (11/12 0805) Pulse Rate:  [83-120] 89  (11/12 0805) Resp:  [10-24] 18  (11/12 0805) BP: (102-162)/(65-92) 102/66 mmHg (11/12 0805) SpO2:  [95 %-100 %] 97 % (11/12 0805)  Intake/Output from previous day: 11/11 0701 - 11/12 0700 In: 2440 [P.O.:240; I.V.:2200] Out: 1410 [Urine:1290; Drains:70; Blood:50] Intake/Output this shift:    Strength out of 5 wound clean and dry  Lab Results: No results found for this basename: WBC:2,HGB:2,HCT:2,PLT:2 in the last 72 hours BMET No results found for this basename: NA:2,K:2,CL:2,CO2:2,GLUCOSE:2,BUN:2,CREATININE:2,CALCIUM:2 in the last 72 hours  Studies/Results: Dg Cervical Spine 1 View  05/17/2012  *RADIOLOGY REPORT*  Clinical Data: C3-6 ACDF.  DG CERVICAL SPINE - 1 VIEW  Comparison: None.  Findings: Single intraoperative fluoroscopic spot view of the cervical spine is provided in the lateral projection. Postoperative changes of C3-6 anterior cervical fusion with interbody spacers are present.  IMPRESSION: Intraoperative visualization of C3-6 ACDF.   Original Report Authenticated By: Leanna Battles, M.D.     Assessment/Plan: Discharge home  LOS: 1 day     Ferguson Gertner P 05/18/2012, 8:49 AM

## 2012-05-18 NOTE — Discharge Summary (Signed)
  Physician Discharge Summary  Patient ID: Melissa Patton MRN: [id number] DOB/AGE: August 13, 1955 56 y.o.  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 *    Discharged Condition: good  Hospital Course: Patient admitted hospital underwent an ACDF at C3-4, C4-5, C5-6 postoperatively patient did very well recovered in the floor on the floor patient was convalescing well and living and voiding spontaneously tolerating regular diet and was stable and be discharged home scheduled followup approximately 1-2 weeks.  Consults: Significant Diagnostic Studies: Treatments: ACDF at C3-4, C4-5, C5-6 Discharge Exam: Blood pressure 102/66, pulse 89, temperature 97.9 F (36.6 C), temperature source Oral, resp. rate 18, SpO2 97.00%. Strength out of 5 wound clean and dry  Disposition: Home     Medication List     As of 05/18/2012  8:51 AM    TAKE these medications         atorvastatin 20 MG tablet   Commonly known as: LIPITOR   Take 1 tablet (20 mg total) by mouth daily.      cyclobenzaprine 10 MG tablet   Commonly known as: FLEXERIL   Take 1 tablet (10 mg total) by mouth 3 (three) times daily as needed for muscle spasms.      etodolac 500 MG tablet   Commonly known as: LODINE   Take 500 mg by mouth 2 (two) times daily as needed. For inflammation      hydroxypropyl methylcellulose 2.5 % ophthalmic solution   Commonly known as: ISOPTO TEARS   Place 1 drop into both eyes daily as needed. For dry eyes      methocarbamol 500 MG tablet   Commonly known as: ROBAXIN   Take 500 mg by mouth 2 (two) times daily as needed. For muscle spasms      oxyCODONE-acetaminophen 5-325 MG per tablet   Commonly known as: PERCOCET/ROXICET   Take 1-2 tablets by mouth every 4 (four) hours as needed.      traMADol 50 MG  tablet   Commonly known as: ULTRAM   Take 50 mg by mouth 2 (two) times daily as needed. For muscle         Signed: Dacota Ruben P 05/18/2012, 8:51 AM

## 2012-06-13 ENCOUNTER — Other Ambulatory Visit: Payer: Self-pay | Admitting: Internal Medicine

## 2012-07-08 ENCOUNTER — Ambulatory Visit: Payer: Self-pay | Admitting: Neurosurgery

## 2012-09-04 ENCOUNTER — Other Ambulatory Visit: Payer: Self-pay | Admitting: Internal Medicine

## 2012-09-27 ENCOUNTER — Ambulatory Visit: Payer: Self-pay | Admitting: Neurosurgery

## 2013-01-04 HISTORY — PX: SHOULDER ARTHROSCOPY: SHX128

## 2013-01-21 ENCOUNTER — Other Ambulatory Visit: Payer: Self-pay | Admitting: Neurosurgery

## 2013-01-21 DIAGNOSIS — R1310 Dysphagia, unspecified: Secondary | ICD-10-CM

## 2013-01-26 ENCOUNTER — Ambulatory Visit
Admission: RE | Admit: 2013-01-26 | Discharge: 2013-01-26 | Disposition: A | Discharge status: Home / Self Care | Payer: BC Managed Care – PPO | Source: Ambulatory Visit | Attending: Neurosurgery | Admitting: Neurosurgery

## 2013-01-26 DIAGNOSIS — R1310 Dysphagia, unspecified: Secondary | ICD-10-CM

## 2013-02-05 ENCOUNTER — Other Ambulatory Visit: Payer: Self-pay | Admitting: Internal Medicine

## 2013-02-24 ENCOUNTER — Encounter: Payer: Self-pay | Admitting: Internal Medicine

## 2013-02-24 ENCOUNTER — Ambulatory Visit (INDEPENDENT_AMBULATORY_CARE_PROVIDER_SITE_OTHER): Payer: BC Managed Care – PPO | Admitting: Internal Medicine

## 2013-02-24 VITALS — BP 130/86 | HR 83 | Temp 98.3°F | Wt 163.0 lb

## 2013-02-24 DIAGNOSIS — M545 Low back pain, unspecified: Secondary | ICD-10-CM

## 2013-02-24 DIAGNOSIS — Z0000 Encounter for general adult medical examination without abnormal findings: Secondary | ICD-10-CM

## 2013-02-24 DIAGNOSIS — G8929 Other chronic pain: Secondary | ICD-10-CM

## 2013-02-24 DIAGNOSIS — E785 Hyperlipidemia, unspecified: Secondary | ICD-10-CM

## 2013-02-24 LAB — CBC WITH DIFFERENTIAL/PLATELET
Basophils Relative: 0.6 % (ref 0.0–3.0)
Eosinophils Relative: 6.4 % — ABNORMAL HIGH (ref 0.0–5.0)
HCT: 39.3 % (ref 36.0–46.0)
Hemoglobin: 13.4 g/dL (ref 12.0–15.0)
Lymphs Abs: 2.2 10*3/uL (ref 0.7–4.0)
Monocytes Relative: 8.9 % (ref 3.0–12.0)
Neutro Abs: 3 10*3/uL (ref 1.4–7.7)
RBC: 4.67 Mil/uL (ref 3.87–5.11)
WBC: 6.2 10*3/uL (ref 4.5–10.5)

## 2013-02-24 LAB — COMPREHENSIVE METABOLIC PANEL
ALT: 39 U/L — ABNORMAL HIGH (ref 0–35)
AST: 28 U/L (ref 0–37)
Albumin: 4.3 g/dL (ref 3.5–5.2)
CO2: 29 mEq/L (ref 19–32)
Calcium: 9.6 mg/dL (ref 8.4–10.5)
Chloride: 102 mEq/L (ref 96–112)
GFR: 80.2 mL/min (ref >60.00)
Potassium: 4.4 mEq/L (ref 3.5–5.1)

## 2013-02-24 LAB — LIPID PANEL: Total CHOL/HDL Ratio: 3

## 2013-02-24 LAB — MICROALBUMIN / CREATININE URINE RATIO: Microalb, Ur: 0.9 mg/dL (ref 0.0–1.9)

## 2013-02-24 MED ORDER — ATORVASTATIN CALCIUM 20 MG PO TABS: ORAL_TABLET | ORAL | Status: DC

## 2013-02-24 NOTE — Assessment & Plan Note (Signed)
Familial hyperlipidemia with LDL greater than 200 on previous check. Will repeat lipids and LFTs with labs today. Continue atorvastatin. Recommended Mediterranean style diet and exercise with a goal of 40 minutes 3 times per week.

## 2013-02-24 NOTE — Assessment & Plan Note (Signed)
Symptoms well controlled on current medications. Followed by pain management.

## 2013-02-24 NOTE — Progress Notes (Signed)
Subjective:    Patient ID: Melissa Patton, female    DOB: 11/24/1955, 57 y.o.   MRN: [id number]  HPI 57 year old female with history of chronic back pain and hyperlipidemia presents for followup. She has been lost to followup for over one year. She reports that she has been compliant with atorvastatin. She denies any side effects from this medication.  In regards to chronic pain, she reports she recently underwent cervical discectomy with some improvement in her symptoms. She also underwent right shoulder arthroscopy with some improvement and chronic shoulder pain. She continues to be followed by pain management.  Outpatient Encounter Prescriptions as of 02/24/2013  Medication Sig Dispense Refill  . atorvastatin (LIPITOR) 20 MG tablet TAKE 1 TABLET BY MOUTH EVERY DAY  90 tablet  4  . naproxen (NAPROSYN) 500 MG tablet       . temazepam (RESTORIL) 15 MG capsule       . [DISCONTINUED] atorvastatin (LIPITOR) 20 MG tablet TAKE 1 TABLET BY MOUTH EVERY DAY  30 tablet  0  . cyclobenzaprine (FLEXERIL) 10 MG tablet Take 1 tablet (10 mg total) by mouth 3 (three) times daily as needed for muscle spasms.  80 tablet  1  . etodolac (LODINE) 500 MG tablet Take 500 mg by mouth 2 (two) times daily as needed. For inflammation      . hydroxypropyl methylcellulose (ISOPTO TEARS) 2.5 % ophthalmic solution Place 1 drop into both eyes daily as needed. For dry eyes      . methocarbamol (ROBAXIN) 500 MG tablet Take 500 mg by mouth 2 (two) times daily as needed. For muscle spasms      . oxyCODONE-acetaminophen (PERCOCET/ROXICET) 5-325 MG per tablet Take 1-2 tablets by mouth every 4 (four) hours as needed.  80 tablet  0  . traMADol (ULTRAM) 50 MG tablet Take 50 mg by mouth 2 (two) times daily as needed. For muscle       No facility-administered encounter medications on file as of 02/24/2013.   BP 130/86  Pulse 83  Temp(Src) 98.3 F (36.8 C) (Oral)  Wt 163 lb (73.936 kg)  BMI 26.32 kg/m2  SpO2 99%  Review of  Systems  Constitutional: Negative for fever, chills, appetite change, fatigue and unexpected weight change.  HENT: Negative for ear pain, congestion, sore throat, trouble swallowing, neck pain, voice change and sinus pressure.   Eyes: Negative for visual disturbance.  Respiratory: Negative for cough, shortness of breath, wheezing and stridor.   Cardiovascular: Negative for chest pain, palpitations and leg swelling.  Gastrointestinal: Negative for nausea, vomiting, abdominal pain, diarrhea, constipation, blood in stool, abdominal distention and anal bleeding.  Genitourinary: Negative for dysuria and flank pain.  Musculoskeletal: Positive for myalgias, back pain and arthralgias. Negative for gait problem.  Skin: Negative for color change and rash.  Neurological: Negative for dizziness and headaches.  Hematological: Negative for adenopathy. Does not bruise/bleed easily.  Psychiatric/Behavioral: Negative for suicidal ideas, sleep disturbance and dysphoric mood. The patient is not nervous/anxious.        Objective:   Physical Exam  Constitutional: She is oriented to person, place, and time. She appears well-developed and well-nourished. No distress.  HENT:  Head: Normocephalic and atraumatic.  Right Ear: External ear normal.  Left Ear: External ear normal.  Nose: Nose normal.  Mouth/Throat: Oropharynx is clear and moist. No oropharyngeal exudate.  Eyes: Conjunctivae are normal. Pupils are equal, round, and reactive to light. Right eye exhibits no discharge. Left eye exhibits no discharge. No scleral icterus.  Neck: Normal range of motion. Neck supple. No tracheal deviation present. No thyromegaly present.  Cardiovascular: Normal rate, regular rhythm, normal heart sounds and intact distal pulses.  Exam reveals no gallop and no friction rub.   No murmur heard. Pulmonary/Chest: Effort normal and breath sounds normal. No accessory muscle usage. Not tachypneic. No respiratory distress. She has no  decreased breath sounds. She has no wheezes. She has no rhonchi. She has no rales. She exhibits no tenderness.  Musculoskeletal: Normal range of motion. She exhibits no edema and no tenderness.  Lymphadenopathy:    She has no cervical adenopathy.  Neurological: She is alert and oriented to person, place, and time. No cranial nerve deficit. She exhibits normal muscle tone. Coordination normal.  Skin: Skin is warm and dry. No rash noted. She is not diaphoretic. No erythema. No pallor.  Psychiatric: She has a normal mood and affect. Her behavior is normal. Judgment and thought content normal.          Assessment & Plan:

## 2013-02-25 ENCOUNTER — Encounter: Payer: Self-pay | Admitting: *Deleted

## 2013-03-21 LAB — HM MAMMOGRAPHY: HM Mammogram: NORMAL

## 2013-04-18 ENCOUNTER — Ambulatory Visit: Payer: Self-pay | Admitting: Internal Medicine

## 2013-04-28 ENCOUNTER — Ambulatory Visit: Payer: Self-pay | Admitting: Neurosurgery

## 2013-05-02 ENCOUNTER — Encounter: Payer: Self-pay | Admitting: Internal Medicine

## 2013-05-06 ENCOUNTER — Telehealth: Payer: Self-pay | Admitting: *Deleted

## 2013-05-06 NOTE — Telephone Encounter (Signed)
Patient walked into the clinic today around 2:50 PM stating her BP was high at Dr. Lonie Peak office. State they told her to go to her PCP office. Went to call patient to bring her back to check BP at 3:27 PM but no response from patient. Patient had left the office, however she did return later and told front desk staff she could not wait that long, she will just call back to schedule an appointment.

## 2013-05-10 ENCOUNTER — Encounter: Payer: Self-pay | Admitting: Adult Health

## 2013-05-10 ENCOUNTER — Other Ambulatory Visit: Payer: Self-pay | Admitting: Adult Health

## 2013-05-10 ENCOUNTER — Ambulatory Visit (INDEPENDENT_AMBULATORY_CARE_PROVIDER_SITE_OTHER): Payer: BC Managed Care – PPO | Admitting: Adult Health

## 2013-05-10 VITALS — BP 130/78 | HR 86 | Temp 98.1°F | Resp 12 | Wt 166.5 lb

## 2013-05-10 DIAGNOSIS — M545 Low back pain, unspecified: Secondary | ICD-10-CM

## 2013-05-10 DIAGNOSIS — R000 Tachycardia, unspecified: Secondary | ICD-10-CM

## 2013-05-10 DIAGNOSIS — R52 Pain, unspecified: Secondary | ICD-10-CM

## 2013-05-10 DIAGNOSIS — I10 Essential (primary) hypertension: Secondary | ICD-10-CM | POA: Insufficient documentation

## 2013-05-10 DIAGNOSIS — N951 Menopausal and female climacteric states: Secondary | ICD-10-CM

## 2013-05-10 DIAGNOSIS — G8929 Other chronic pain: Secondary | ICD-10-CM

## 2013-05-10 DIAGNOSIS — R002 Palpitations: Secondary | ICD-10-CM

## 2013-05-10 DIAGNOSIS — R232 Flushing: Secondary | ICD-10-CM

## 2013-05-10 LAB — TSH: TSH: 1.68 u[IU]/mL (ref 0.35–5.50)

## 2013-05-10 LAB — BASIC METABOLIC PANEL
CO2: 29 mEq/L (ref 19–32)
Calcium: 10.1 mg/dL (ref 8.4–10.5)
Chloride: 103 mEq/L (ref 96–112)
Potassium: 5 mEq/L (ref 3.5–5.1)
Sodium: 139 mEq/L (ref 135–145)

## 2013-05-10 MED ORDER — HYDROCHLOROTHIAZIDE 25 MG PO TABS: 25.0000 mg | ORAL_TABLET | Freq: Every day | ORAL | Status: DC

## 2013-05-10 NOTE — Assessment & Plan Note (Signed)
Menopausal symptoms. Recommend trying OTC Estroven.

## 2013-05-10 NOTE — Assessment & Plan Note (Signed)
Ongoing pain and not able to tolerate multiple pain medication 2/2 allergic reaction. Refer to pain clinic for evaluation and other recommendations.  Patient has taken hydrocodone, oxycodone, tramadol and has had allergic reaction to all - throat swelling.

## 2013-05-10 NOTE — Progress Notes (Signed)
Pre-visit discussion using our clinic review tool. No additional management support is needed unless otherwise documented below in the visit note.  

## 2013-05-10 NOTE — Assessment & Plan Note (Signed)
Suspect b/p elevation may be related to uncontrolled pain. Refer to pain clinic. Will also check TSH. Start HCTZ 25 mg. RTC for follow up in 2 weeks to see Dr. Dan Humphreys.

## 2013-05-10 NOTE — Progress Notes (Signed)
  Subjective:    Patient ID: Melissa Patton, female    DOB: 05/04/1956, 57 y.o.   MRN: [id number]  HPI  Patient is a pleasant 57 year old female with history of hyperlipidemia, chronic low back pain, cervicalgia s/p anterior cervical discectomy with fusion (05/2012), also status post right shoulder arthroscopy (01/2013), chronic pain with history of multiple allergies to pain medicine who presents to clinic with several concerns:  1) Reports elevated b/p at another doctor's office (Dr. Dola Argyle) 150/102, 150/100, 145/95, 140/86. She reports not having any history of high blood pressure.  2) Hot flashes. Feeling hot and cold. Wakes up in the middle of the night. Palpitations occasionally.  3) Uncontrolled pain - not able to take multiple pain medications ordered 2/2 allergic reaction   Review of Systems  Respiratory: Negative.   Cardiovascular: Positive for palpitations.       Elevated blood pressure  Gastrointestinal: Negative.   Endocrine: Positive for cold intolerance and heat intolerance.  Musculoskeletal: Positive for back pain.       Chronic pain - back, shoulders,   Psychiatric/Behavioral: Negative.   All other systems reviewed and are negative.       Objective:   Physical Exam  Constitutional: She is oriented to person, place, and time. She appears well-developed and well-nourished. No distress.  Cardiovascular: Normal rate, regular rhythm and normal heart sounds.  Exam reveals no gallop and no friction rub.   No murmur heard. B/P recheck 140/84  Pulmonary/Chest: Effort normal and breath sounds normal. No respiratory distress. She has no wheezes. She has no rales.  Musculoskeletal: Normal range of motion.  Neurological: She is alert and oriented to person, place, and time.  Skin: Skin is warm and dry.  Psychiatric: She has a normal mood and affect. Her behavior is normal. Judgment and thought content normal.    BP 130/78  Pulse 86  Temp(Src) 98.1 F (36.7 C) (Oral)   Resp 12  Wt 166 lb 8 oz (75.524 kg)  SpO2 97%       Assessment & Plan:

## 2013-05-10 NOTE — Patient Instructions (Signed)
  I am starting you HCTZ 25 mg daily for your blood pressure.  Return for follow up in 2 weeks to see Dr. Dan Humphreys.  For your menopausal symptoms, try Jeffie Pollock which is sold over the counter.  I am referring you to the pain clinic for your uncontrolled pain and history of multiple allergies.

## 2013-05-12 ENCOUNTER — Ambulatory Visit (INDEPENDENT_AMBULATORY_CARE_PROVIDER_SITE_OTHER): Payer: BC Managed Care – PPO | Admitting: *Deleted

## 2013-05-12 DIAGNOSIS — R002 Palpitations: Secondary | ICD-10-CM

## 2013-05-12 NOTE — Progress Notes (Signed)
Placed 48 hour holter monitor per Rey,Raquel on pt on 05/12/13-05/14/13 @10 :35 am.

## 2013-05-27 ENCOUNTER — Ambulatory Visit (INDEPENDENT_AMBULATORY_CARE_PROVIDER_SITE_OTHER): Payer: BC Managed Care – PPO | Admitting: Internal Medicine

## 2013-05-27 ENCOUNTER — Encounter: Payer: Self-pay | Admitting: Internal Medicine

## 2013-05-27 VITALS — BP 120/82 | HR 84 | Temp 98.4°F | Wt 167.0 lb

## 2013-05-27 DIAGNOSIS — N951 Menopausal and female climacteric states: Secondary | ICD-10-CM

## 2013-05-27 DIAGNOSIS — I10 Essential (primary) hypertension: Secondary | ICD-10-CM

## 2013-05-27 DIAGNOSIS — R232 Flushing: Secondary | ICD-10-CM

## 2013-05-27 DIAGNOSIS — M545 Low back pain, unspecified: Secondary | ICD-10-CM | POA: Insufficient documentation

## 2013-05-27 DIAGNOSIS — G8929 Other chronic pain: Secondary | ICD-10-CM

## 2013-05-27 MED ORDER — FLUOXETINE HCL 20 MG PO TABS: 20.0000 mg | ORAL_TABLET | Freq: Every day | ORAL | Status: DC

## 2013-05-27 MED ORDER — LIDOCAINE 5 % EX PTCH: 1.0000 | MEDICATED_PATCH | CUTANEOUS | Status: DC

## 2013-05-27 NOTE — Assessment & Plan Note (Signed)
Persistent pain despite use of nonsteroidal anti-inflammatory medications and muscle relaxers. Patient has also had physical therapy in the past with no improvement. Referral to pain clinic is pending. Given persistent symptoms and radiating pain, as well as her history of DJD, will get MRI lumbar spine for further evaluation. Question if she would benefit from [facility]

## 2013-05-27 NOTE — Assessment & Plan Note (Signed)
Persistent symptoms of hot flashes despite use of over-the-counter estrogen preparation. Will start fluoxetine 20 mg daily to see if any improvement. Followup in 4 weeks or sooner as needed.

## 2013-05-27 NOTE — Progress Notes (Signed)
Pre-visit discussion using our clinic review tool. No additional management support is needed unless otherwise documented below in the visit note.  

## 2013-05-27 NOTE — Assessment & Plan Note (Signed)
Blood pressure better controlled with hydrochlorothiazide. Will continue.

## 2013-05-27 NOTE — Progress Notes (Signed)
Subjective:    Patient ID: Melissa Patton, female    DOB: 09-04-1955, 57 y.o.   MRN: [id number]  HPI 57 year old female with history of hyperlipidemia and chronic back pain presents for followup after recent acute visit. At her recent visit, she complained of hot flashes which were occurring frequently. She was encouraged to try over-the-counter estrogen. She reports some improvement in hot flashes with use of Estroven, however still having occasional hot flashes at night which keep her from sleeping.  At her last visit, she was also noted to have elevated blood pressure. This is thought to be secondary to significant uncontrolled back pain. She was started on hydrochlorothiazide. She has not been checking her blood pressure but reports no headache, chest pain, palpitations.  She also reports significant mid and lower back pain which occasionally radiates down the back of both of her legs. She has had extensive cervical spine surgery in the past. She has not had evaluation of her lumbar spine. She has been taking Aleve and Robaxin with minimal improvement. Pain keeps her awake at night. It is exacerbated by physical activity. She denies loss of control of bowel or bladder. She also denies weakness in her legs or numbness in her legs.   Outpatient Prescriptions Prior to Visit  Medication Sig Dispense Refill  . atorvastatin (LIPITOR) 20 MG tablet TAKE 1 TABLET BY MOUTH EVERY DAY  90 tablet  4  . cyclobenzaprine (FLEXERIL) 10 MG tablet Take 1 tablet (10 mg total) by mouth 3 (three) times daily as needed for muscle spasms.  80 tablet  1  . etodolac (LODINE) 500 MG tablet Take 500 mg by mouth 2 (two) times daily as needed. For inflammation      . hydrochlorothiazide (HYDRODIURIL) 25 MG tablet Take 1 tablet (25 mg total) by mouth daily.  30 tablet  3  . methocarbamol (ROBAXIN) 500 MG tablet Take 500 mg by mouth 2 (two) times daily as needed. For muscle spasms      . naproxen (NAPROSYN) 500 MG tablet        . temazepam (RESTORIL) 15 MG capsule        No facility-administered medications prior to visit.   BP 120/82  Pulse 84  Temp(Src) 98.4 F (36.9 C) (Oral)  Wt 167 lb (75.751 kg)  SpO2 99%  Review of Systems  Constitutional: Positive for diaphoresis and fatigue. Negative for fever, chills, appetite change and unexpected weight change.  HENT: Negative for congestion, ear pain, sinus pressure, sore throat, trouble swallowing and voice change.   Eyes: Negative for visual disturbance.  Respiratory: Negative for cough, shortness of breath, wheezing and stridor.   Cardiovascular: Negative for chest pain, palpitations and leg swelling.  Gastrointestinal: Negative for nausea, vomiting, abdominal pain, diarrhea, constipation, blood in stool, abdominal distention and anal bleeding.  Endocrine: Positive for heat intolerance.  Genitourinary: Negative for dysuria and flank pain.  Musculoskeletal: Positive for arthralgias, back pain and myalgias. Negative for gait problem and neck pain.  Skin: Negative for color change and rash.  Neurological: Negative for dizziness and headaches.  Hematological: Negative for adenopathy. Does not bruise/bleed easily.  Psychiatric/Behavioral: Positive for sleep disturbance. Negative for suicidal ideas and dysphoric mood. The patient is nervous/anxious.        Objective:   Physical Exam  Constitutional: She is oriented to person, place, and time. She appears well-developed and well-nourished. No distress.  HENT:  Head: Normocephalic and atraumatic.  Right Ear: External ear normal.  Left Ear: External ear  normal.  Nose: Nose normal.  Mouth/Throat: Oropharynx is clear and moist. No oropharyngeal exudate.  Eyes: Conjunctivae are normal. Pupils are equal, round, and reactive to light. Right eye exhibits no discharge. Left eye exhibits no discharge. No scleral icterus.  Neck: Normal range of motion. Neck supple. No tracheal deviation present. No thyromegaly  present.  Cardiovascular: Normal rate, regular rhythm, normal heart sounds and intact distal pulses.  Exam reveals no gallop and no friction rub.   No murmur heard. Pulmonary/Chest: Effort normal and breath sounds normal. No accessory muscle usage. Not tachypneic. No respiratory distress. She has no decreased breath sounds. She has no wheezes. She has no rhonchi. She has no rales. She exhibits no tenderness.  Musculoskeletal: Normal range of motion. She exhibits no edema and no tenderness.  Lymphadenopathy:    She has no cervical adenopathy.  Neurological: She is alert and oriented to person, place, and time. No cranial nerve deficit. She exhibits normal muscle tone. Coordination normal.  Skin: Skin is warm and dry. No rash noted. She is not diaphoretic. No erythema. No pallor.  Psychiatric: She has a normal mood and affect. Her behavior is normal. Judgment and thought content normal.          Assessment & Plan:

## 2013-05-27 NOTE — Patient Instructions (Signed)
Start Fluoxetine daily to help with hot flashes.   Start Lidoderm patches for your back pain. We will schedule MRI.  Follow up 4 weeks.

## 2013-06-07 ENCOUNTER — Telehealth: Payer: Self-pay | Admitting: *Deleted

## 2013-06-07 NOTE — Telephone Encounter (Signed)
Informed pt that her monitor results showed normal sinus rhythm with sinus tach, no significant arrythmia per Dr Kirke Corin. Results will be faxed to Raquel Ray.

## 2013-06-08 ENCOUNTER — Encounter: Payer: Self-pay | Admitting: Cardiovascular Disease

## 2013-06-08 ENCOUNTER — Encounter (INDEPENDENT_AMBULATORY_CARE_PROVIDER_SITE_OTHER): Payer: BC Managed Care – PPO

## 2013-06-10 ENCOUNTER — Ambulatory Visit: Payer: Self-pay | Admitting: Internal Medicine

## 2013-06-10 ENCOUNTER — Telehealth: Payer: Self-pay | Admitting: Internal Medicine

## 2013-06-10 NOTE — Telephone Encounter (Signed)
MRI lumbar spine showed multilevel degenerative disc disease with nerve impingement at L4-5. I would recommend that we set up evaluation with pain management (looks like this is in process) or with Dr. Yves Dill at [facility] for evaluation and possible steroid injection or other interventions.

## 2013-06-10 NOTE — Telephone Encounter (Signed)
Patient informed and verbalized understanding. She has an appointment with pain management already, it is scheduled for 07/20/13.

## 2013-07-04 ENCOUNTER — Ambulatory Visit: Payer: BC Managed Care – PPO | Admitting: Internal Medicine

## 2013-07-04 ENCOUNTER — Encounter: Payer: Self-pay | Admitting: Internal Medicine

## 2013-07-05 ENCOUNTER — Encounter: Payer: Self-pay | Admitting: Adult Health

## 2013-07-05 ENCOUNTER — Encounter (INDEPENDENT_AMBULATORY_CARE_PROVIDER_SITE_OTHER): Payer: Self-pay

## 2013-07-05 ENCOUNTER — Ambulatory Visit (INDEPENDENT_AMBULATORY_CARE_PROVIDER_SITE_OTHER): Payer: BC Managed Care – PPO | Admitting: Adult Health

## 2013-07-05 VITALS — BP 110/66 | HR 99 | Temp 98.2°F | Resp 12 | Wt 168.5 lb

## 2013-07-05 DIAGNOSIS — H6120 Impacted cerumen, unspecified ear: Secondary | ICD-10-CM

## 2013-07-05 DIAGNOSIS — H6123 Impacted cerumen, bilateral: Secondary | ICD-10-CM

## 2013-07-05 NOTE — Assessment & Plan Note (Signed)
Bilateral ear irrigation and cerumen removal.

## 2013-07-05 NOTE — Progress Notes (Signed)
   Subjective:    Patient ID: Melissa Patton, female    DOB: March 22, 1956, 57 y.o.   MRN: [id number]  HPI Presents to clinic with ear fullness. She reports hx of cerumen buildup.   Review of Systems  HENT: Negative for congestion, ear discharge, ear pain, hearing loss, postnasal drip, rhinorrhea and sinus pressure.        Ear fullness mainly on the left side       Objective:   Physical Exam  Constitutional: She is oriented to person, place, and time. She appears well-developed and well-nourished. No distress.  HENT:  Head: Normocephalic and atraumatic.  Bilateral ears with cerumen buildup. Unable to visualize TM  Neurological: She is alert and oriented to person, place, and time.  Skin: Skin is warm and dry.  Psychiatric: She has a normal mood and affect. Her behavior is normal. Judgment and thought content normal.          Assessment & Plan:

## 2013-07-19 ENCOUNTER — Ambulatory Visit (INDEPENDENT_AMBULATORY_CARE_PROVIDER_SITE_OTHER): Payer: BC Managed Care – PPO | Admitting: Internal Medicine

## 2013-07-19 ENCOUNTER — Encounter: Payer: Self-pay | Admitting: Internal Medicine

## 2013-07-19 VITALS — BP 120/84 | HR 103 | Temp 98.2°F | Wt 168.0 lb

## 2013-07-19 DIAGNOSIS — I10 Essential (primary) hypertension: Secondary | ICD-10-CM

## 2013-07-19 DIAGNOSIS — M545 Low back pain, unspecified: Secondary | ICD-10-CM

## 2013-07-19 DIAGNOSIS — R209 Unspecified disturbances of skin sensation: Secondary | ICD-10-CM

## 2013-07-19 DIAGNOSIS — R413 Other amnesia: Secondary | ICD-10-CM

## 2013-07-19 DIAGNOSIS — M542 Cervicalgia: Secondary | ICD-10-CM

## 2013-07-19 DIAGNOSIS — R200 Anesthesia of skin: Secondary | ICD-10-CM

## 2013-07-19 NOTE — Progress Notes (Signed)
Pre-visit discussion using our clinic review tool. No additional management support is needed unless otherwise documented below in the visit note.  

## 2013-07-20 LAB — CBC WITH DIFFERENTIAL/PLATELET
Basophils Absolute: 0 10*3/uL (ref 0.0–0.1)
Basophils Relative: 0.3 % (ref 0.0–3.0)
EOS ABS: 0.3 10*3/uL (ref 0.0–0.7)
Eosinophils Relative: 4.1 % (ref 0.0–5.0)
HEMATOCRIT: 41.9 % (ref 36.0–46.0)
HEMOGLOBIN: 14 g/dL (ref 12.0–15.0)
LYMPHS ABS: 2.1 10*3/uL (ref 0.7–4.0)
Lymphocytes Relative: 29.5 % (ref 12.0–46.0)
MCHC: 33.4 g/dL (ref 30.0–36.0)
MCV: 86 fl (ref 78.0–100.0)
MONOS PCT: 10.8 % (ref 3.0–12.0)
Monocytes Absolute: 0.8 10*3/uL (ref 0.1–1.0)
Neutro Abs: 3.9 10*3/uL (ref 1.4–7.7)
Neutrophils Relative %: 55.3 % (ref 43.0–77.0)
Platelets: 307 10*3/uL (ref 150.0–400.0)
RBC: 4.87 Mil/uL (ref 3.87–5.11)
RDW: 12.9 % (ref 11.5–14.6)
WBC: 7.1 10*3/uL (ref 4.5–10.5)

## 2013-07-20 LAB — COMPREHENSIVE METABOLIC PANEL
ALBUMIN: 4.4 g/dL (ref 3.5–5.2)
ALT: 35 U/L (ref 0–35)
AST: 31 U/L (ref 0–37)
Alkaline Phosphatase: 69 U/L (ref 39–117)
BUN: 15 mg/dL (ref 6–23)
CO2: 29 mEq/L (ref 19–32)
Calcium: 10 mg/dL (ref 8.4–10.5)
Chloride: 101 mEq/L (ref 96–112)
Creatinine, Ser: 1 mg/dL (ref 0.4–1.2)
GFR: 62.45 mL/min (ref >60.00)
Glucose, Bld: 69 mg/dL — ABNORMAL LOW (ref 70–99)
POTASSIUM: 4.4 meq/L (ref 3.5–5.1)
SODIUM: 138 meq/L (ref 135–145)
Total Bilirubin: 0.8 mg/dL (ref 0.3–1.2)
Total Protein: 7.4 g/dL (ref 6.0–8.3)

## 2013-07-20 LAB — TSH: TSH: 2.02 u[IU]/mL (ref 0.35–5.50)

## 2013-07-20 LAB — VITAMIN B12: Vitamin B-12: 366 pg/mL (ref 211–911)

## 2013-07-20 NOTE — Assessment & Plan Note (Signed)
Patient and patient's husband report memory loss. Exam is grossly normal today. Will set up cognitive testing for more thorough evaluation.

## 2013-07-20 NOTE — Assessment & Plan Note (Signed)
Left arm pain and intermittent numbness concerning for cervical radiculopathy. Will get MRI of the cervical spine for additional evaluation.

## 2013-07-20 NOTE — Assessment & Plan Note (Signed)
Minimal improvement and chronic low back pain with nonsteroidal anti-inflammatory drugs, Robaxin, and Lidoderm patch. Evaluation by pain management scheduled for tomorrow. Will follow.

## 2013-07-20 NOTE — Progress Notes (Signed)
Subjective:    Patient ID: Melissa HatchetBeverly G Monrreal, female    DOB: 1956-06-12, 58 y.o.   MRN: [id number]  HPI 58 year old female with history of chronic upper and lower back pain status post lumbar laminectomy and cervical discectomy presents for followup. She continues to have pain in her lower back. She has had minimal improvement on current medications. She is scheduled to be evaluated by pain management tomorrow. Her primary concern today is worsening pain in her neck with radiation down her left arm and weakness in her left hand. She also occasionally has whitish discoloration of the tip of her left index finger. She has been dropping objects at home. She has been unable to work in her job as a [employer] because of weakness in her left arm.  Her husband is also concerned about changes in her memory. He attributes this to some of the medication she has been taking as well as frequent surgical procedures. She notes that she is occasionally confused and forgetful. She has a history of early dementia and her family with her father.  Outpatient Encounter Prescriptions as of 07/19/2013  Medication Sig  . atorvastatin (LIPITOR) 20 MG tablet TAKE 1 TABLET BY MOUTH EVERY DAY  . cyclobenzaprine (FLEXERIL) 10 MG tablet Take 1 tablet (10 mg total) by mouth 3 (three) times daily as needed for muscle spasms.  Marland Kitchen. etodolac (LODINE) 500 MG tablet Take 500 mg by mouth 2 (two) times daily as needed. For inflammation  . FLUoxetine (PROZAC) 20 MG tablet Take 1 tablet (20 mg total) by mouth daily.  . hydrochlorothiazide (HYDRODIURIL) 25 MG tablet Take 1 tablet (25 mg total) by mouth daily.  Marland Kitchen. ketotifen (ALAWAY) 0.025 % ophthalmic solution 1 drop 2 (two) times daily.  Marland Kitchen. lidocaine (LIDODERM) 5 % Place 1 patch onto the skin daily. Remove & Discard patch within 12 hours or as directed by MD  . methocarbamol (ROBAXIN) 500 MG tablet Take 500 mg by mouth 2 (two) times daily as needed. For muscle spasms  . naproxen (NAPROSYN) 500  MG tablet   . temazepam (RESTORIL) 15 MG capsule Take 15 mg by mouth at bedtime as needed.    BP 120/84  Pulse 103  Temp(Src) 98.2 F (36.8 C) (Oral)  Wt 168 lb (76.204 kg)  SpO2 95%  Review of Systems  Constitutional: Negative for fever, chills, appetite change, fatigue and unexpected weight change.  HENT: Negative for congestion, ear pain, sinus pressure, sore throat, trouble swallowing and voice change.   Eyes: Negative for visual disturbance.  Respiratory: Negative for cough, shortness of breath, wheezing and stridor.   Cardiovascular: Negative for chest pain, palpitations and leg swelling.  Gastrointestinal: Negative for nausea, vomiting, abdominal pain, diarrhea, constipation, blood in stool, abdominal distention and anal bleeding.  Genitourinary: Negative for dysuria and flank pain.  Musculoskeletal: Positive for arthralgias, back pain, myalgias and neck pain. Negative for gait problem.  Skin: Negative for color change and rash.  Neurological: Positive for weakness and numbness. Negative for dizziness and headaches.  Hematological: Negative for adenopathy. Does not bruise/bleed easily.  Psychiatric/Behavioral: Positive for confusion and decreased concentration. Negative for suicidal ideas, sleep disturbance and dysphoric mood. The patient is not nervous/anxious.        Objective:   Physical Exam  Constitutional: She is oriented to person, place, and time. She appears well-developed and well-nourished. No distress.  HENT:  Head: Normocephalic and atraumatic.  Right Ear: External ear normal.  Left Ear: External ear normal.  Nose: Nose normal.  Mouth/Throat: Oropharynx is clear and moist. No oropharyngeal exudate.  Eyes: Conjunctivae are normal. Pupils are equal, round, and reactive to light. Right eye exhibits no discharge. Left eye exhibits no discharge. No scleral icterus.  Neck: Normal range of motion. Neck supple. No tracheal deviation present. No thyromegaly present.    Cardiovascular: Normal rate, regular rhythm, normal heart sounds and intact distal pulses.  Exam reveals no gallop and no friction rub.   No murmur heard. Pulmonary/Chest: Effort normal and breath sounds normal. No accessory muscle usage. Not tachypneic. No respiratory distress. She has no decreased breath sounds. She has no wheezes. She has no rhonchi. She has no rales. She exhibits no tenderness.  Musculoskeletal: She exhibits no edema and no tenderness.       Left shoulder: She exhibits decreased strength. She exhibits normal range of motion, no tenderness, no bony tenderness and no pain.       Left hand: Decreased sensation noted. Decreased sensation is present in the radial distribution. Decreased strength noted. She exhibits finger abduction and thumb/finger opposition.  Lymphadenopathy:    She has no cervical adenopathy.  Neurological: She is alert and oriented to person, place, and time. No cranial nerve deficit. She exhibits normal muscle tone. Coordination normal.  Skin: Skin is warm and dry. No rash noted. She is not diaphoretic. No erythema. No pallor.  Psychiatric: She has a normal mood and affect. Her behavior is normal. Judgment and thought content normal.          Assessment & Plan:

## 2013-07-20 NOTE — Assessment & Plan Note (Signed)
BP Readings from Last 3 Encounters:  07/19/13 120/84  07/05/13 110/66  05/27/13 120/82   Blood pressure well-controlled on hydrochlorothiazide. Will continue.

## 2013-07-21 ENCOUNTER — Telehealth: Payer: Self-pay | Admitting: Internal Medicine

## 2013-07-21 ENCOUNTER — Ambulatory Visit: Payer: Self-pay | Admitting: Pain Medicine

## 2013-07-21 ENCOUNTER — Encounter: Payer: Self-pay | Admitting: *Deleted

## 2013-07-21 ENCOUNTER — Other Ambulatory Visit: Payer: Self-pay | Admitting: Pain Medicine

## 2013-07-21 LAB — BASIC METABOLIC PANEL
Anion Gap: 3 — ABNORMAL LOW (ref 7–16)
BUN: 13 mg/dL (ref 7–18)
Calcium, Total: 9.7 mg/dL (ref 8.5–10.1)
Chloride: 102 mmol/L (ref 98–107)
Co2: 31 mmol/L (ref 21–32)
Creatinine: 0.87 mg/dL (ref 0.60–1.30)
EGFR (African American): >60
EGFR (Non-African Amer.): >60
GLUCOSE: 97 mg/dL (ref 65–99)
Osmolality: 272 (ref 275–301)
POTASSIUM: 4.2 mmol/L (ref 3.5–5.1)
SODIUM: 136 mmol/L (ref 136–145)

## 2013-07-21 LAB — MAGNESIUM: Magnesium: 2.2 mg/dL

## 2013-07-21 LAB — RENAL FUNCTION PANEL
Albumin: 4.2 g/dL (ref 3.4–5.0)
Phosphorus: 3.4 mg/dL (ref 2.5–4.9)

## 2013-07-21 LAB — SEDIMENTATION RATE: Erythrocyte Sed Rate: 9 mm/hr (ref 0–30)

## 2013-07-21 NOTE — Telephone Encounter (Signed)
FYI to Dr. Walker 

## 2013-07-21 NOTE — Telephone Encounter (Signed)
Pt came in today and wanted you know dr walker sent her to pain management with dr Sherlyn Haynevar  He is going to send ms Salmela to a neruologist @ [facility]  Dr Sherlyn Haynevar is going to give pt a shot in left shoulder for pain

## 2013-07-23 ENCOUNTER — Ambulatory Visit: Payer: Self-pay | Admitting: Internal Medicine

## 2013-07-23 NOTE — Telephone Encounter (Signed)
MRI cervical spine showed stable changes from prior surgery, however there was nerve impingement on the left at C6-7. I would like to set up follow up with her neurosurgeon to review this. Does she have follow up scheduled?

## 2013-07-27 ENCOUNTER — Telehealth: Payer: Self-pay | Admitting: Internal Medicine

## 2013-07-27 NOTE — Telephone Encounter (Signed)
Patient informed of this and has a follow up already scheduled with neurosurgeon on the 29th of this month.

## 2013-07-27 NOTE — Telephone Encounter (Signed)
Pt left vm.  States she has a question.  No further information left on vm.

## 2013-07-28 NOTE — Telephone Encounter (Signed)
Spoke with patient, she was asking about the forms we completed. If we had a copy of it because she mailed the originals and forgot to make a copy for herself. Informed patient it was sent for scanning and we do not have a copy at this moment.

## 2013-08-10 ENCOUNTER — Telehealth: Payer: Self-pay | Admitting: Internal Medicine

## 2013-08-10 NOTE — Telephone Encounter (Signed)
Relevant patient education mailed to patient.  

## 2013-08-17 ENCOUNTER — Other Ambulatory Visit: Payer: Self-pay | Admitting: Internal Medicine

## 2013-08-17 NOTE — Telephone Encounter (Signed)
Ok refill? 

## 2013-08-19 ENCOUNTER — Ambulatory Visit (INDEPENDENT_AMBULATORY_CARE_PROVIDER_SITE_OTHER): Payer: BC Managed Care – PPO | Admitting: Internal Medicine

## 2013-08-19 ENCOUNTER — Encounter: Payer: Self-pay | Admitting: Internal Medicine

## 2013-08-19 VITALS — BP 118/84 | HR 91 | Temp 98.0°F | Wt 170.0 lb

## 2013-08-19 DIAGNOSIS — M542 Cervicalgia: Secondary | ICD-10-CM

## 2013-08-19 DIAGNOSIS — G8929 Other chronic pain: Secondary | ICD-10-CM

## 2013-08-19 DIAGNOSIS — M545 Low back pain, unspecified: Secondary | ICD-10-CM

## 2013-08-19 MED ORDER — LIDOCAINE 5 % EX PTCH: MEDICATED_PATCH | CUTANEOUS | Status: DC

## 2013-08-19 NOTE — Progress Notes (Signed)
Pre-visit discussion using our clinic review tool. No additional management support is needed unless otherwise documented below in the visit note.  

## 2013-08-19 NOTE — Assessment & Plan Note (Signed)
Chronic cervicalgia secondary to cervical stenosis. Will request recent notes from her neurosurgeon, Dr. Wynetta Emeryram. Continue Lodine, Lidoderm, and Flexeril prn.

## 2013-08-19 NOTE — Assessment & Plan Note (Signed)
Secondary to DJD. Symptomatically doing relatively well with current medications including Lodine, Lidoderm, and prn Flexeril. Will continue. Follow up with pain management as scheduled. Follow up here in 3 months and prn.

## 2013-08-19 NOTE — Progress Notes (Signed)
Subjective:    Patient ID: Melissa Patton, female    DOB: Oct 04, 1955, 57 y.o.   MRN: [id number]  HPI 58YO female with chronic back pain presents for follow up.  Recently seen by Dr. Wynetta Emery in [facility]. Per her report, he recommended surgical intervention. Pt declined because of history of multiple failed interventions in the past. Pt also went to see Dr. Marchia Bond at [facility]. No changes made to medications. Reports pain generally well controlled with Lidoderm and prn Flexeril. Pain max 7/10 with increased activity and at baseline generally 3-4/10.  Review of Systems  Constitutional: Negative for fever, chills, appetite change, fatigue and unexpected weight change.  HENT: Negative for trouble swallowing.   Eyes: Negative for visual disturbance.  Respiratory: Negative for cough and shortness of breath.   Cardiovascular: Negative for chest pain and leg swelling.  Gastrointestinal: Negative for abdominal pain and constipation.  Genitourinary: Negative for dysuria and flank pain.  Musculoskeletal: Positive for arthralgias, back pain, myalgias and neck pain. Negative for gait problem.  Skin: Negative for color change and rash.  Neurological: Positive for weakness. Negative for dizziness and headaches.  Psychiatric/Behavioral: Negative for suicidal ideas, sleep disturbance and dysphoric mood. The patient is not nervous/anxious.        Objective:    BP 118/84  Pulse 91  Temp(Src) 98 F (36.7 C) (Oral)  Wt 170 lb (77.111 kg)  SpO2 96% Physical Exam  Constitutional: She is oriented to person, place, and time. She appears well-developed and well-nourished. No distress.  HENT:  Head: Normocephalic and atraumatic.  Right Ear: External ear normal.  Left Ear: External ear normal.  Nose: Nose normal.  Mouth/Throat: Oropharynx is clear and moist. No oropharyngeal exudate.  Eyes: Conjunctivae are normal. Pupils are equal, round, and reactive to light. Right eye exhibits no discharge. Left  eye exhibits no discharge. No scleral icterus.  Neck: Normal range of motion. Neck supple. No tracheal deviation present. No thyromegaly present.  Cardiovascular: Normal rate, regular rhythm, normal heart sounds and intact distal pulses.  Exam reveals no gallop and no friction rub.   No murmur heard. Pulmonary/Chest: Effort normal and breath sounds normal. No accessory muscle usage. Not tachypneic. No respiratory distress. She has no decreased breath sounds. She has no wheezes. She has no rhonchi. She has no rales. She exhibits no tenderness.  Musculoskeletal: She exhibits no edema.       Cervical back: She exhibits decreased range of motion, tenderness and pain. She exhibits no bony tenderness.       Lumbar back: She exhibits decreased range of motion, tenderness and pain. She exhibits no bony tenderness.  Lymphadenopathy:    She has no cervical adenopathy.  Neurological: She is alert and oriented to person, place, and time. No cranial nerve deficit. She exhibits normal muscle tone. Coordination normal.  Skin: Skin is warm and dry. No rash noted. She is not diaphoretic. No erythema. No pallor.  Psychiatric: She has a normal mood and affect. Her behavior is normal. Judgment and thought content normal.          Assessment & Plan:   Problem List Items Addressed This Visit   Cervicalgia - Primary     Chronic cervicalgia secondary to cervical stenosis. Will request recent notes from her neurosurgeon, Dr. Wynetta Emery. Continue Lodine, Lidoderm, and Flexeril prn.    Chronic low back pain     Secondary to DJD. Symptomatically doing relatively well with current medications including Lodine, Lidoderm, and prn Flexeril. Will  continue. Follow up with pain management as scheduled. Follow up here in 3 months and prn.    Relevant Medications      lidocaine (LIDODERM) 5 %       Return in about 3 months (around 11/16/2013).

## 2013-09-09 ENCOUNTER — Other Ambulatory Visit: Payer: Self-pay | Admitting: Adult Health

## 2013-09-23 ENCOUNTER — Other Ambulatory Visit: Payer: Self-pay | Admitting: Internal Medicine

## 2013-11-18 ENCOUNTER — Encounter: Payer: Self-pay | Admitting: Internal Medicine

## 2013-11-18 ENCOUNTER — Ambulatory Visit (INDEPENDENT_AMBULATORY_CARE_PROVIDER_SITE_OTHER): Payer: BC Managed Care – PPO | Admitting: Internal Medicine

## 2013-11-18 ENCOUNTER — Encounter (INDEPENDENT_AMBULATORY_CARE_PROVIDER_SITE_OTHER): Payer: Self-pay

## 2013-11-18 ENCOUNTER — Encounter: Payer: Self-pay | Admitting: *Deleted

## 2013-11-18 VITALS — BP 100/80 | HR 89 | Temp 98.1°F | Wt 162.8 lb

## 2013-11-18 DIAGNOSIS — M545 Low back pain, unspecified: Secondary | ICD-10-CM

## 2013-11-18 DIAGNOSIS — I10 Essential (primary) hypertension: Secondary | ICD-10-CM

## 2013-11-18 DIAGNOSIS — R413 Other amnesia: Secondary | ICD-10-CM

## 2013-11-18 DIAGNOSIS — M542 Cervicalgia: Secondary | ICD-10-CM

## 2013-11-18 DIAGNOSIS — G8929 Other chronic pain: Secondary | ICD-10-CM

## 2013-11-18 LAB — COMPREHENSIVE METABOLIC PANEL
ALK PHOS: 60 U/L (ref 39–117)
ALT: 31 U/L (ref 0–35)
AST: 29 U/L (ref 0–37)
Albumin: 4.3 g/dL (ref 3.5–5.2)
BILIRUBIN TOTAL: 0.9 mg/dL (ref 0.2–1.2)
BUN: 12 mg/dL (ref 6–23)
CO2: 29 mEq/L (ref 19–32)
Calcium: 9.8 mg/dL (ref 8.4–10.5)
Chloride: 100 mEq/L (ref 96–112)
Creatinine, Ser: 0.9 mg/dL (ref 0.4–1.2)
GFR: 67.94 mL/min (ref >60.00)
GLUCOSE: 92 mg/dL (ref 70–99)
Potassium: 4.6 mEq/L (ref 3.5–5.1)
Sodium: 138 mEq/L (ref 135–145)
TOTAL PROTEIN: 7.5 g/dL (ref 6.0–8.3)

## 2013-11-18 NOTE — Progress Notes (Signed)
Pre visit review using our clinic review tool, if applicable. No additional management support is needed unless otherwise documented below in the visit note. 

## 2013-11-18 NOTE — Assessment & Plan Note (Signed)
Chronic cervicalgia secondary to cervical spinal stenosis. Pt has opted not to proceed with additional cervical intervention. Will continue Lodine, Lidoderm, and Flexeril.

## 2013-11-18 NOTE — Progress Notes (Signed)
Subjective:    Patient ID: Melissa Patton, female    DOB: February 21, 1956, 58 y.o.   MRN: [id number]  HPI 58YO female presents for follow up.  Continues to have pain in neck and lower back. Pain symptoms improved with Lidoderm patchy. Has decided not to proceed with any more surgery. Generally pain level at 6/10, with use of medication.  Notes some decreased urination since starting on HCTZ.  Notes some improvement in energy level with taking B-complex vitamins. However continues to limit activity some because of chronic pain.  Review of Systems  Constitutional: Negative for fever, chills, appetite change, fatigue and unexpected weight change.  HENT: Negative for congestion, ear pain, sinus pressure, sore throat, trouble swallowing and voice change.   Eyes: Negative for visual disturbance.  Respiratory: Negative for cough, shortness of breath, wheezing and stridor.   Cardiovascular: Negative for chest pain, palpitations and leg swelling.  Gastrointestinal: Negative for nausea, vomiting, abdominal pain, diarrhea, constipation, blood in stool, abdominal distention and anal bleeding.  Genitourinary: Negative for dysuria and flank pain.  Musculoskeletal: Positive for arthralgias, back pain, myalgias, neck pain and neck stiffness. Negative for gait problem.  Skin: Negative for color change and rash.  Neurological: Negative for dizziness and headaches.  Hematological: Negative for adenopathy. Does not bruise/bleed easily.  Psychiatric/Behavioral: Negative for suicidal ideas, sleep disturbance and dysphoric mood. The patient is not nervous/anxious.        Objective:    BP 100/80  Pulse 89  Temp(Src) 98.1 F (36.7 C) (Oral)  Wt 162 lb 12 oz (73.823 kg)  SpO2 95% Physical Exam  Constitutional: She is oriented to person, place, and time. She appears well-developed and well-nourished. No distress.  HENT:  Head: Normocephalic and atraumatic.  Right Ear: External ear normal.  Left Ear:  External ear normal.  Nose: Nose normal.  Mouth/Throat: Oropharynx is clear and moist. No oropharyngeal exudate.  Eyes: Conjunctivae are normal. Pupils are equal, round, and reactive to light. Right eye exhibits no discharge. Left eye exhibits no discharge. No scleral icterus.  Neck: Normal range of motion. Neck supple. No tracheal deviation present. No thyromegaly present.  Cardiovascular: Normal rate, regular rhythm, normal heart sounds and intact distal pulses.  Exam reveals no gallop and no friction rub.   No murmur heard. Pulmonary/Chest: Effort normal and breath sounds normal. No accessory muscle usage. Not tachypneic. No respiratory distress. She has no decreased breath sounds. She has no wheezes. She has no rhonchi. She has no rales. She exhibits no tenderness.  Musculoskeletal: She exhibits no edema.       Cervical back: She exhibits decreased range of motion, tenderness and pain. She exhibits no edema and no deformity.       Lumbar back: She exhibits decreased range of motion, tenderness and pain. She exhibits no edema and no deformity.  Lymphadenopathy:    She has no cervical adenopathy.  Neurological: She is alert and oriented to person, place, and time. No cranial nerve deficit. She exhibits normal muscle tone. Coordination normal.  Skin: Skin is warm and dry. No rash noted. She is not diaphoretic. No erythema. No pallor.  Psychiatric: She has a normal mood and affect. Her behavior is normal. Judgment and thought content normal.          Assessment & Plan:   Problem List Items Addressed This Visit   Cervicalgia     Chronic cervicalgia secondary to cervical spinal stenosis. Pt has opted not to proceed with additional cervical intervention. Will  continue Lodine, Lidoderm, and Flexeril.    Chronic low back pain - Primary     Secondary to DJD. Doing well on current medication regimen. Will continue Lodine, Lidoderm, and Flexeril.    HTN (hypertension)      BP Readings from  Last 3 Encounters:  11/18/13 100/80  08/19/13 118/84  07/19/13 120/84   BP well controlled on HCTZ. Will continue.    Relevant Orders      Comp Met (CMET)   Memory loss     Some persistent symptoms of short term memory loss. Evaluation including labs with TSH and B12 was normal. Exam is normal. Likely related, in part, to medication use. Pt has deferred cognitive testing for now. Will follow.        Return in about 3 months (around 02/18/2014) for Physical.

## 2013-11-18 NOTE — Assessment & Plan Note (Signed)
Some persistent symptoms of short term memory loss. Evaluation including labs with TSH and B12 was normal. Exam is normal. Likely related, in part, to medication use. Pt has deferred cognitive testing for now. Will follow.

## 2013-11-18 NOTE — Assessment & Plan Note (Signed)
BP Readings from Last 3 Encounters:  11/18/13 100/80  08/19/13 118/84  07/19/13 120/84   BP well controlled on HCTZ. Will continue.

## 2013-11-18 NOTE — Assessment & Plan Note (Signed)
Secondary to DJD. Doing well on current medication regimen. Will continue Lodine, Lidoderm, and Flexeril.

## 2013-11-21 ENCOUNTER — Telehealth: Payer: Self-pay | Admitting: Internal Medicine

## 2013-11-21 NOTE — Telephone Encounter (Signed)
Relevant patient education mailed to patient.  

## 2013-12-27 ENCOUNTER — Other Ambulatory Visit: Payer: Self-pay | Admitting: Internal Medicine

## 2014-02-27 ENCOUNTER — Encounter: Payer: BC Managed Care – PPO | Admitting: Internal Medicine

## 2014-02-27 DIAGNOSIS — Z0289 Encounter for other administrative examinations: Secondary | ICD-10-CM

## 2014-03-17 ENCOUNTER — Other Ambulatory Visit: Payer: Self-pay | Admitting: Internal Medicine

## 2014-03-21 ENCOUNTER — Ambulatory Visit (INDEPENDENT_AMBULATORY_CARE_PROVIDER_SITE_OTHER): Payer: BC Managed Care – PPO | Admitting: Internal Medicine

## 2014-03-21 ENCOUNTER — Encounter: Payer: Self-pay | Admitting: Internal Medicine

## 2014-03-21 VITALS — BP 124/84 | HR 85 | Temp 97.9°F | Ht 63.5 in | Wt 164.0 lb

## 2014-03-21 DIAGNOSIS — Z0000 Encounter for general adult medical examination without abnormal findings: Secondary | ICD-10-CM

## 2014-03-21 LAB — COMPREHENSIVE METABOLIC PANEL
ALK PHOS: 64 U/L (ref 39–117)
ALT: 33 U/L (ref 0–35)
AST: 24 U/L (ref 0–37)
Albumin: 4.1 g/dL (ref 3.5–5.2)
BILIRUBIN TOTAL: 0.8 mg/dL (ref 0.2–1.2)
BUN: 13 mg/dL (ref 6–23)
CO2: 29 mEq/L (ref 19–32)
Calcium: 9.4 mg/dL (ref 8.4–10.5)
Chloride: 97 mEq/L (ref 96–112)
Creatinine, Ser: 0.8 mg/dL (ref 0.4–1.2)
GFR: 84.83 mL/min (ref >60.00)
Glucose, Bld: 79 mg/dL (ref 70–99)
Potassium: 4.3 mEq/L (ref 3.5–5.1)
Sodium: 135 mEq/L (ref 135–145)
Total Protein: 7.1 g/dL (ref 6.0–8.3)

## 2014-03-21 LAB — LIPID PANEL
CHOLESTEROL: 209 mg/dL — AB (ref 0–200)
HDL: 58.5 mg/dL (ref >39.00)
LDL CALC: 134 mg/dL — AB (ref 0–99)
NonHDL: 150.5
TRIGLYCERIDES: 84 mg/dL (ref 0.0–149.0)
Total CHOL/HDL Ratio: 4
VLDL: 16.8 mg/dL (ref 0.0–40.0)

## 2014-03-21 LAB — VITAMIN D 25 HYDROXY (VIT D DEFICIENCY, FRACTURES): VITD: 26.38 ng/mL — AB (ref 30.00–100.00)

## 2014-03-21 LAB — HM PAP SMEAR

## 2014-03-21 MED ORDER — ATORVASTATIN CALCIUM 20 MG PO TABS: ORAL_TABLET | ORAL | Status: DC

## 2014-03-21 MED ORDER — CYCLOBENZAPRINE HCL 10 MG PO TABS: 10.0000 mg | ORAL_TABLET | Freq: Three times a day (TID) | ORAL | Status: DC | PRN

## 2014-03-21 MED ORDER — NAPROXEN 500 MG PO TABS: 500.0000 mg | ORAL_TABLET | Freq: Two times a day (BID) | ORAL | Status: DC

## 2014-03-21 NOTE — Patient Instructions (Signed)

## 2014-03-21 NOTE — Assessment & Plan Note (Addendum)
General medical exam normal today including breast exam. PAP and pelvic deferred as pt s/p hysterectomy. Flu vaccine declined. Follow up colonoscopy declined. Labs including CBC, CMP, lipids today. Encouraged healthy diet and exercise.

## 2014-03-21 NOTE — Progress Notes (Signed)
Subjective:    Patient ID: Melissa Patton, female    DOB: [birth year], 58 y.o.   MRN: [id number]  HPI 58YO presents for annual exam. Generally feeling well. Continues to have chronic neck and back pain. Followed by Dr. Wynetta Emery. Taking Naprosyn with some improvement. Due for mammogram, would like to schedule. Due for colonoscopy, but prefers to hold off for now. Declines flu vaccine.  Review of Systems  Constitutional: Negative for fever, chills, appetite change, fatigue and unexpected weight change.  Eyes: Negative for visual disturbance.  Respiratory: Negative for shortness of breath.   Cardiovascular: Negative for chest pain and leg swelling.  Gastrointestinal: Negative for nausea, vomiting, abdominal pain, diarrhea, constipation and blood in stool.  Musculoskeletal: Positive for arthralgias, back pain, myalgias, neck pain and neck stiffness (chronic).  Skin: Negative for color change and rash.  Hematological: Negative for adenopathy. Does not bruise/bleed easily.  Psychiatric/Behavioral: Negative for dysphoric mood. The patient is not nervous/anxious.        Objective:    BP 124/84  Pulse 85  Temp(Src) 97.9 F (36.6 C) (Oral)  Ht 5' 3.5" (1.613 m)  Wt 164 lb (74.39 kg)  BMI 28.59 kg/m2  SpO2 97% Physical Exam  Constitutional: She is oriented to person, place, and time. She appears well-developed and well-nourished. No distress.  HENT:  Head: Normocephalic and atraumatic.  Right Ear: External ear normal.  Left Ear: External ear normal.  Nose: Nose normal.  Mouth/Throat: Oropharynx is clear and moist. No oropharyngeal exudate.  Eyes: Conjunctivae are normal. Pupils are equal, round, and reactive to light. Right eye exhibits no discharge. Left eye exhibits no discharge. No scleral icterus.  Neck: Normal range of motion. Neck supple. No tracheal deviation present. No thyromegaly present.  Cardiovascular: Normal rate, regular rhythm, normal heart sounds and intact distal  pulses.  Exam reveals no gallop and no friction rub.   No murmur heard. Pulmonary/Chest: Effort normal and breath sounds normal. No accessory muscle usage. Not tachypneic. No respiratory distress. She has no decreased breath sounds. She has no wheezes. She has no rales. She exhibits no tenderness. Right breast exhibits no inverted nipple, no mass, no nipple discharge, no skin change and no tenderness. Left breast exhibits no inverted nipple, no mass, no nipple discharge, no skin change and no tenderness. Breasts are symmetrical.  Abdominal: Soft. Bowel sounds are normal. She exhibits no distension and no mass. There is no tenderness. There is no rebound and no guarding.  Musculoskeletal: Normal range of motion. She exhibits no edema and no tenderness.  Lymphadenopathy:    She has no cervical adenopathy.  Neurological: She is alert and oriented to person, place, and time. No cranial nerve deficit. She exhibits normal muscle tone. Coordination normal.  Skin: Skin is warm and dry. No rash noted. She is not diaphoretic. No erythema. No pallor.  Psychiatric: She has a normal mood and affect. Her behavior is normal. Judgment and thought content normal.          Assessment & Plan:   Problem List Items Addressed This Visit     Unprioritized   Routine general medical examination at a health care facility - Primary     General medical exam normal today including breast exam. PAP and pelvic deferred as pt s/p hysterectomy. Flu vaccine declined. Follow up colonoscopy declined. Labs including CBC, CMP, lipids today. Encouraged healthy diet and exercise.    Relevant Orders      CBC with Differential  Comprehensive metabolic panel      Lipid panel      Microalbumin / creatinine urine ratio      Vit D  25 hydroxy (rtn osteoporosis monitoring)      MM Digital Screening       Return in about 6 months (around 09/19/2014) for Recheck.

## 2014-03-21 NOTE — Progress Notes (Signed)
Pre visit review using our clinic review tool, if applicable. No additional management support is needed unless otherwise documented below in the visit note. 

## 2014-03-22 LAB — CBC WITH DIFFERENTIAL/PLATELET
BASOS PCT: 0.6 % (ref 0.0–3.0)
Basophils Absolute: 0 10*3/uL (ref 0.0–0.1)
Eosinophils Absolute: 0.3 10*3/uL (ref 0.0–0.7)
Eosinophils Relative: 5 % (ref 0.0–5.0)
HEMATOCRIT: 40.5 % (ref 36.0–46.0)
Hemoglobin: 13.5 g/dL (ref 12.0–15.0)
LYMPHS ABS: 1.9 10*3/uL (ref 0.7–4.0)
Lymphocytes Relative: 26.9 % (ref 12.0–46.0)
MCHC: 33.3 g/dL (ref 30.0–36.0)
MCV: 86.5 fl (ref 78.0–100.0)
MONO ABS: 0.6 10*3/uL (ref 0.1–1.0)
Monocytes Relative: 8.7 % (ref 3.0–12.0)
Neutro Abs: 4.1 10*3/uL (ref 1.4–7.7)
Neutrophils Relative %: 58.8 % (ref 43.0–77.0)
PLATELETS: 317 10*3/uL (ref 150.0–400.0)
RBC: 4.68 Mil/uL (ref 3.87–5.11)
RDW: 13.3 % (ref 11.5–15.5)
WBC: 6.9 10*3/uL (ref 4.0–10.5)

## 2014-03-22 LAB — MICROALBUMIN / CREATININE URINE RATIO
Creatinine,U: 30.6 mg/dL
MICROALB UR: 0.2 mg/dL (ref 0.0–1.9)
MICROALB/CREAT RATIO: 0.7 mg/g (ref 0.0–30.0)

## 2014-03-23 ENCOUNTER — Encounter: Payer: Self-pay | Admitting: *Deleted

## 2014-03-31 ENCOUNTER — Other Ambulatory Visit: Payer: Self-pay | Admitting: Internal Medicine

## 2014-05-27 ENCOUNTER — Other Ambulatory Visit: Payer: Self-pay | Admitting: Internal Medicine

## 2014-05-29 NOTE — Telephone Encounter (Signed)
Last OV 9.15.15, last refill 10.14.15.  Please advise refill.

## 2014-06-26 ENCOUNTER — Other Ambulatory Visit: Payer: Self-pay | Admitting: Internal Medicine

## 2014-06-28 ENCOUNTER — Telehealth: Payer: Self-pay | Admitting: *Deleted

## 2014-06-28 NOTE — Telephone Encounter (Signed)
Fax from pharmacy, needing PA for Lidocaine patches. Started online, given to Dr. Dan HumphreysWalker for signature, then fax

## 2014-09-23 ENCOUNTER — Other Ambulatory Visit: Payer: Self-pay | Admitting: Internal Medicine

## 2014-10-02 ENCOUNTER — Encounter: Payer: Self-pay | Admitting: Internal Medicine

## 2014-10-02 ENCOUNTER — Ambulatory Visit (INDEPENDENT_AMBULATORY_CARE_PROVIDER_SITE_OTHER): Payer: BC Managed Care – PPO | Admitting: Internal Medicine

## 2014-10-02 VITALS — BP 114/74 | HR 80 | Temp 97.8°F | Ht 63.5 in | Wt 171.0 lb

## 2014-10-02 DIAGNOSIS — H9193 Unspecified hearing loss, bilateral: Secondary | ICD-10-CM | POA: Diagnosis not present

## 2014-10-02 DIAGNOSIS — M545 Low back pain: Secondary | ICD-10-CM

## 2014-10-02 DIAGNOSIS — G8929 Other chronic pain: Secondary | ICD-10-CM | POA: Diagnosis not present

## 2014-10-02 NOTE — Assessment & Plan Note (Signed)
Bilateral hearing loss resolved with removal of cerumen today. Will continue home use of Debrox and follow up prn if any recurrent symptoms.

## 2014-10-02 NOTE — Progress Notes (Signed)
Pre visit review using our clinic review tool, if applicable. No additional management support is needed unless otherwise documented below in the visit note. 

## 2014-10-02 NOTE — Patient Instructions (Signed)
Follow up in 03/2015 for physical exam.

## 2014-10-02 NOTE — Assessment & Plan Note (Signed)
Will set up referral for ongoing care with Dr. Wynetta Emeryram.

## 2014-10-02 NOTE — Progress Notes (Signed)
   Subjective:    Patient ID: Melissa LeekBeverly M [place], female    DOB: May 27, 1956, 59 y.o.   MRN: [id number]  HPI  59YO female presents for acute visit.  Hearing problem - Notes left sided almost complete hearing loss over the last 2 weeks. Seemed to improve for a couple days, then worsen again. No pain. No drainage from ear.  Past medical, surgical, family and social history per today's encounter.  Review of Systems  Constitutional: Negative for fever, chills, appetite change, fatigue and unexpected weight change.  HENT: Positive for hearing loss. Negative for congestion, ear discharge, ear pain, postnasal drip, rhinorrhea, sore throat, tinnitus, trouble swallowing and voice change.   Eyes: Negative for visual disturbance.  Respiratory: Negative for shortness of breath.   Cardiovascular: Negative for chest pain and leg swelling.  Gastrointestinal: Negative for abdominal pain.  Musculoskeletal: Positive for myalgias, back pain and arthralgias.  Skin: Negative for color change and rash.  Hematological: Negative for adenopathy. Does not bruise/bleed easily.  Psychiatric/Behavioral: Negative for dysphoric mood. The patient is not nervous/anxious.        Objective:    BP 114/74 mmHg  Pulse 80  Temp(Src) 97.8 F (36.6 C) (Oral)  Ht 5' 3.5" (1.613 m)  Wt 171 lb (77.565 kg)  BMI 29.81 kg/m2  SpO2 100% Physical Exam  Constitutional: She is oriented to person, place, and time. She appears well-developed and well-nourished. No distress.  HENT:  Head: Normocephalic and atraumatic.  Right Ear: Tympanic membrane and external ear normal.  Left Ear: Tympanic membrane and external ear normal.  Nose: Nose normal.  Mouth/Throat: Oropharynx is clear and moist. No oropharyngeal exudate.  Bilateral ear canals were initially impacted with cerumen, which resolved with warm water lavage. Hearing loss resolved with removal of cerumen.  Eyes: Conjunctivae are normal. Pupils are equal, round, and reactive  to light. Right eye exhibits no discharge. Left eye exhibits no discharge. No scleral icterus.  Neck: Normal range of motion. Neck supple. No tracheal deviation present. No thyromegaly present.  Pulmonary/Chest: Effort normal. No accessory muscle usage. No tachypnea. She has no decreased breath sounds. She has no rhonchi.  Musculoskeletal: Normal range of motion. She exhibits no edema or tenderness.  Lymphadenopathy:    She has no cervical adenopathy.  Neurological: She is alert and oriented to person, place, and time. No cranial nerve deficit. She exhibits normal muscle tone. Coordination normal.  Skin: Skin is warm and dry. No rash noted. She is not diaphoretic. No erythema. No pallor.  Psychiatric: She has a normal mood and affect. Her behavior is normal. Judgment and thought content normal.          Assessment & Plan:   Problem List Items Addressed This Visit      Unprioritized   Chronic low back pain    Will set up referral for ongoing care with Dr. Wynetta Emeryram.      Relevant Orders   Ambulatory referral to Neurosurgery   Hearing loss - Primary    Bilateral hearing loss resolved with removal of cerumen today. Will continue home use of Debrox and follow up prn if any recurrent symptoms.          Return in about 6 months (around 04/04/2015) for Physical.

## 2014-10-03 ENCOUNTER — Other Ambulatory Visit: Payer: Self-pay | Admitting: Internal Medicine

## 2014-10-11 ENCOUNTER — Telehealth: Payer: Self-pay

## 2014-10-11 NOTE — Telephone Encounter (Signed)
[facility] hoping to get the pt a [employer] ref.  (pt's apt is June 16th)

## 2014-10-29 ENCOUNTER — Other Ambulatory Visit: Payer: Self-pay | Admitting: Internal Medicine

## 2014-11-30 ENCOUNTER — Other Ambulatory Visit: Payer: Self-pay | Admitting: Internal Medicine

## 2014-11-30 NOTE — Telephone Encounter (Signed)
Ok refill? 

## 2014-12-28 DIAGNOSIS — Z6828 Body mass index (BMI) 28.0-28.9, adult: Secondary | ICD-10-CM | POA: Diagnosis not present

## 2014-12-28 DIAGNOSIS — M5023 Other cervical disc displacement, cervicothoracic region: Secondary | ICD-10-CM | POA: Diagnosis not present

## 2015-01-20 ENCOUNTER — Other Ambulatory Visit: Payer: Self-pay | Admitting: Internal Medicine

## 2015-02-23 ENCOUNTER — Other Ambulatory Visit: Payer: Self-pay | Admitting: Internal Medicine

## 2015-02-26 NOTE — Telephone Encounter (Signed)
Last OV 3.28.16.  please advise refill

## 2015-02-26 NOTE — Telephone Encounter (Signed)
Spoke with pt she states she will wait until her 9.30.16 appoint for refills.

## 2015-02-27 ENCOUNTER — Other Ambulatory Visit: Payer: Self-pay | Admitting: Surgical

## 2015-02-27 MED ORDER — FLUOXETINE HCL 20 MG PO CAPS: 20.0000 mg | ORAL_CAPSULE | Freq: Every day | ORAL | Status: DC

## 2015-02-27 NOTE — Telephone Encounter (Signed)
Please advise pharmacy faxed over requesting a 90 day supply instead of 30 day supply.

## 2015-02-27 NOTE — Addendum Note (Signed)
Addended by: Dorian Pod on: 02/27/2015 01:22 PM   Modules accepted: Orders

## 2015-03-20 ENCOUNTER — Other Ambulatory Visit: Payer: Self-pay | Admitting: *Deleted

## 2015-03-20 MED ORDER — ATORVASTATIN CALCIUM 20 MG PO TABS: ORAL_TABLET | ORAL | Status: DC

## 2015-04-06 ENCOUNTER — Encounter: Payer: Self-pay | Admitting: General Surgery

## 2015-04-06 ENCOUNTER — Other Ambulatory Visit: Payer: Self-pay

## 2015-04-06 ENCOUNTER — Ambulatory Visit (INDEPENDENT_AMBULATORY_CARE_PROVIDER_SITE_OTHER): Payer: Commercial Managed Care - HMO | Admitting: Internal Medicine

## 2015-04-06 ENCOUNTER — Encounter: Payer: Self-pay | Admitting: Internal Medicine

## 2015-04-06 VITALS — BP 126/79 | HR 77 | Temp 98.0°F | Ht 63.5 in | Wt 174.2 lb

## 2015-04-06 DIAGNOSIS — E785 Hyperlipidemia, unspecified: Secondary | ICD-10-CM | POA: Diagnosis not present

## 2015-04-06 DIAGNOSIS — Z1239 Encounter for other screening for malignant neoplasm of breast: Secondary | ICD-10-CM

## 2015-04-06 DIAGNOSIS — I10 Essential (primary) hypertension: Secondary | ICD-10-CM | POA: Diagnosis not present

## 2015-04-06 DIAGNOSIS — Z23 Encounter for immunization: Secondary | ICD-10-CM

## 2015-04-06 DIAGNOSIS — M545 Low back pain: Secondary | ICD-10-CM

## 2015-04-06 DIAGNOSIS — K219 Gastro-esophageal reflux disease without esophagitis: Secondary | ICD-10-CM | POA: Diagnosis not present

## 2015-04-06 DIAGNOSIS — Z1211 Encounter for screening for malignant neoplasm of colon: Secondary | ICD-10-CM | POA: Diagnosis not present

## 2015-04-06 DIAGNOSIS — G8929 Other chronic pain: Secondary | ICD-10-CM

## 2015-04-06 LAB — CBC WITH DIFFERENTIAL/PLATELET
BASOS ABS: 0 10*3/uL (ref 0.0–0.1)
BASOS PCT: 0.6 % (ref 0.0–3.0)
EOS ABS: 0.3 10*3/uL (ref 0.0–0.7)
Eosinophils Relative: 4 % (ref 0.0–5.0)
HEMATOCRIT: 41.6 % (ref 36.0–46.0)
HEMOGLOBIN: 13.6 g/dL (ref 12.0–15.0)
LYMPHS PCT: 28.4 % (ref 12.0–46.0)
Lymphs Abs: 1.9 10*3/uL (ref 0.7–4.0)
MCHC: 32.6 g/dL (ref 30.0–36.0)
MCV: 86.8 fl (ref 78.0–100.0)
MONO ABS: 0.6 10*3/uL (ref 0.1–1.0)
Monocytes Relative: 8.4 % (ref 3.0–12.0)
Neutro Abs: 3.9 10*3/uL (ref 1.4–7.7)
Neutrophils Relative %: 58.6 % (ref 43.0–77.0)
PLATELETS: 304 10*3/uL (ref 150.0–400.0)
RBC: 4.8 Mil/uL (ref 3.87–5.11)
RDW: 13.8 % (ref 11.5–15.5)
WBC: 6.6 10*3/uL (ref 4.0–10.5)

## 2015-04-06 LAB — COMPREHENSIVE METABOLIC PANEL
ALT: 21 U/L (ref 0–35)
AST: 18 U/L (ref 0–37)
Albumin: 4.3 g/dL (ref 3.5–5.2)
Alkaline Phosphatase: 56 U/L (ref 39–117)
BILIRUBIN TOTAL: 0.6 mg/dL (ref 0.2–1.2)
BUN: 17 mg/dL (ref 6–23)
CALCIUM: 9.8 mg/dL (ref 8.4–10.5)
CHLORIDE: 102 meq/L (ref 96–112)
CO2: 30 meq/L (ref 19–32)
CREATININE: 0.83 mg/dL (ref 0.40–1.20)
GFR: 75.19 mL/min (ref >60.00)
Glucose, Bld: 112 mg/dL — ABNORMAL HIGH (ref 70–99)
Potassium: 5 mEq/L (ref 3.5–5.1)
SODIUM: 141 meq/L (ref 135–145)
Total Protein: 6.9 g/dL (ref 6.0–8.3)

## 2015-04-06 LAB — LIPID PANEL
CHOL/HDL RATIO: 3
Cholesterol: 185 mg/dL (ref 0–200)
HDL: 59.7 mg/dL (ref >39.00)
LDL CALC: 106 mg/dL — AB (ref 0–99)
NONHDL: 125.52
Triglycerides: 100 mg/dL (ref 0.0–149.0)
VLDL: 20 mg/dL (ref 0.0–40.0)

## 2015-04-06 LAB — TSH: TSH: 1.73 u[IU]/mL (ref 0.35–4.50)

## 2015-04-06 MED ORDER — TEMAZEPAM 15 MG PO CAPS: 15.0000 mg | ORAL_CAPSULE | Freq: Every evening | ORAL | Status: DC | PRN

## 2015-04-06 MED ORDER — NAPROXEN 500 MG PO TABS: 500.0000 mg | ORAL_TABLET | Freq: Two times a day (BID) | ORAL | Status: DC

## 2015-04-06 MED ORDER — CYCLOBENZAPRINE HCL 10 MG PO TABS: 10.0000 mg | ORAL_TABLET | Freq: Three times a day (TID) | ORAL | Status: DC | PRN

## 2015-04-06 NOTE — Assessment & Plan Note (Signed)
BP Readings from Last 3 Encounters:  04/06/15 126/79  10/02/14 114/74  03/21/14 124/84   BP well controlled. Renal function with labs. Continue HCTZ.

## 2015-04-06 NOTE — Assessment & Plan Note (Signed)
Will set up screening colonoscopy. 

## 2015-04-06 NOTE — Progress Notes (Signed)
Subjective:    Patient ID: Melissa Patton, female    DOB: 22-Jul-1955, 59 y.o.   MRN: [id number]  HPI  59YO female presents for follow up.  Chronic back pain - Recently has been off Naproxen. Back pain has been worse. Described as aching. Worse on left side and lower back.  Sometimes, feels weak in her legs. MRI in 2014 showed DJD and impingement L4-5. Previously followed by Dr. Wynetta Emery.  GERD - Recent increased belching and some upper epigastric pain at night. Limiting soda with some improvement. Took Omeprazole in the past, but this caused some diarrhea.  Wt Readings from Last 3 Encounters:  04/06/15 174 lb 4 oz (79.039 kg)  10/02/14 171 lb (77.565 kg)  03/21/14 164 lb (74.39 kg)   BP Readings from Last 3 Encounters:  04/06/15 126/79  10/02/14 114/74  03/21/14 124/84    Past Medical History  Diagnosis Date  . Chicken pox   . Urinary incontinence   . Hyperlipidemia     takes Atorvastatin daily  . Arthritis   . Chronic neck pain     DDD/spondylosis/stenosis  . IBS (irritable bowel syndrome)   . Urinary frequency   . Urinary urgency   . Nocturia   . Dry eyes     dry eyes d/t use of contacts  . Insomnia    Family History  Problem Relation Age of Onset  . Heart disease Mother   . Diabetes Mother   . Heart disease Father   . Diabetes Father   . Diabetes Brother   . Depression Brother    Past Surgical History  Procedure Laterality Date  . Carpal tunnel release    . Hammer toe    . Abdominal hysterectomy      30 years ago  . Lumbar laminectomy  1989, 1991    [facility], Dr. Elesa Hacker  . Vaginal delivery    . Foot surgery      d/t hammer toe-bil  . Tubal ligation    . Back surgery      x 2  . Back pain      hx of buldging disc  . Colonoscopy    . Esophagogastroduodenoscopy    . Anterior cervical decomp/discectomy fusion  05/17/2012    Procedure: ANTERIOR CERVICAL DECOMPRESSION/DISCECTOMY FUSION 3 LEVELS;  Surgeon: Mariam Dollar, MD;  Location: [facility];   Service: Neurosurgery;  Laterality: Bilateral;  Cervical three-four, four-five, five-six anterior cervical discectomy with fusion  . Shoulder arthroscopy Right 01/2013    Dr. Caryn Bee Supple - [facility]   Social History   Social History  . Marital Status: Married    Spouse Name: N/A  . Number of Children: N/A  . Years of Education: N/A   Social History Main Topics  . Smoking status: Former Games developer  . Smokeless tobacco: None     Comment: quit smoking many yrs ago  . Alcohol Use: No  . Drug Use: No  . Sexual Activity: Not Currently    Birth Control/ Protection: Surgical   Other Topics Concern  . None   Social History Narrative   Lives in [place] with husband. Dog in home. 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  Eyes: Negative for visual disturbance.  Respiratory: Negative for  shortness of breath.   Cardiovascular: Negative for chest pain and leg swelling.  Gastrointestinal: Positive for abdominal pain. Negative for nausea, vomiting, diarrhea, constipation and blood in stool.  Musculoskeletal: Positive for myalgias, back pain and arthralgias.  Skin: Negative for color change and rash.  Hematological: Negative for adenopathy. Does not bruise/bleed easily.  Psychiatric/Behavioral: Negative for sleep disturbance and dysphoric mood. The patient is not nervous/anxious.        Objective:    BP 126/79 mmHg  Pulse 77  Temp(Src) 98 F (36.7 C) (Oral)  Ht 5' 3.5" (1.613 m)  Wt 174 lb 4 oz (79.039 kg)  BMI 30.38 kg/m2  SpO2 96% Physical Exam  Constitutional: She is oriented to person, place, and time. She appears well-developed and well-nourished. No distress.  HENT:  Head: Normocephalic and atraumatic.  Right Ear: External ear normal.  Left Ear: External ear normal.  Nose: Nose normal.   Mouth/Throat: Oropharynx is clear and moist. No oropharyngeal exudate.  Eyes: Conjunctivae are normal. Pupils are equal, round, and reactive to light. Right eye exhibits no discharge. Left eye exhibits no discharge. No scleral icterus.  Neck: Normal range of motion. Neck supple. No tracheal deviation present. No thyromegaly present.  Cardiovascular: Normal rate, regular rhythm, normal heart sounds and intact distal pulses.  Exam reveals no gallop and no friction rub.   No murmur heard. Pulmonary/Chest: Effort normal and breath sounds normal. No respiratory distress. She has no wheezes. She has no rales. She exhibits no tenderness.  Abdominal: Normal appearance and bowel sounds are normal. There is no tenderness.  Musculoskeletal: Normal range of motion. She exhibits no edema.       Thoracic back: She exhibits tenderness and pain. She exhibits normal range of motion and no bony tenderness.       Back:  Lymphadenopathy:    She has no cervical adenopathy.  Neurological: She is alert and oriented to person, place, and time. No cranial nerve deficit. She exhibits normal muscle tone. Coordination normal.  Skin: Skin is warm and dry. No rash noted. She is not diaphoretic. No erythema. No pallor.  Psychiatric: She has a normal mood and affect. Her behavior is normal. Judgment and thought content normal.          Assessment & Plan:   Problem List Items Addressed This Visit      Unprioritized   Chronic low back pain - Primary    Chronic low back pain, now with increased mid/upper back pain most like from muscular strain. Will add back Naproxen. Continue Lidoderm patch and prn Flexeril. Follow up if symptoms not improving.      Relevant Medications   naproxen (NAPROSYN) 500 MG tablet   Other Relevant Orders   CBC with Differential/Platelet   Esophageal reflux    Symptoms of GERD recently exacerbated. She has not recently been taking Naproxen. Suspect increased fiber intake has contributed  to belching. Intolerant of PPI. Will add Zantac  at bedtime. If no improvement, discussed referral for EGD.      HTN (hypertension)    BP Readings from Last 3 Encounters:  04/06/15 126/79  10/02/14 114/74  03/21/14 124/84   BP well controlled. Renal function with labs. Continue HCTZ.      Relevant Orders   TSH   Hyperlipidemia    Will check lipids with labs today. Continue Atorvastatin.      Relevant Orders   Comprehensive metabolic panel   Lipid panel   Special screening for malignant neoplasms, colon  Will set up screening colonoscopy.      Relevant Orders   Ambulatory referral to General Surgery    Other Visit Diagnoses    Screening for breast cancer        Relevant Orders    MM Digital Screening        Return in about 6 months (around 10/04/2015) for Physical.

## 2015-04-06 NOTE — Progress Notes (Signed)
Pre visit review using our clinic review tool, if applicable. No additional management support is needed unless otherwise documented below in the visit note. 

## 2015-04-06 NOTE — Assessment & Plan Note (Signed)
Will check lipids with labs today. Continue Atorvastatin. 

## 2015-04-06 NOTE — Assessment & Plan Note (Signed)
Symptoms of GERD recently exacerbated. She has not recently been taking Naproxen. Suspect increased fiber intake has contributed to belching. Intolerant of PPI. Will add Zantac  at bedtime. If no improvement, discussed referral for EGD.

## 2015-04-06 NOTE — Patient Instructions (Addendum)
Labs today.  Follow up in 6 months or sooner as needed. 

## 2015-04-06 NOTE — Assessment & Plan Note (Signed)
Chronic low back pain, now with increased mid/upper back pain most like from muscular strain. Will add back Naproxen. Continue Lidoderm patch and prn Flexeril. Follow up if symptoms not improving.

## 2015-04-10 ENCOUNTER — Encounter: Payer: Self-pay | Admitting: *Deleted

## 2015-05-07 ENCOUNTER — Encounter: Payer: Self-pay | Admitting: General Surgery

## 2015-05-07 ENCOUNTER — Ambulatory Visit (INDEPENDENT_AMBULATORY_CARE_PROVIDER_SITE_OTHER): Payer: Commercial Managed Care - HMO | Admitting: General Surgery

## 2015-05-07 VITALS — BP 110/78 | HR 68 | Resp 13 | Ht 66.0 in | Wt 175.6 lb

## 2015-05-07 DIAGNOSIS — Z1211 Encounter for screening for malignant neoplasm of colon: Secondary | ICD-10-CM | POA: Diagnosis not present

## 2015-05-07 MED ORDER — POLYETHYLENE GLYCOL 3350 17 GM/SCOOP PO POWD: ORAL | Status: DC

## 2015-05-07 NOTE — Progress Notes (Signed)
Patient ID: Melissa Patton, female   DOB: [birth year], 59 y.o.   MRN: [id number]  Chief Complaint  Patient presents with  . Colonoscopy    HPI Melissa LeekBeverly M Celmer is a 59 y.o. female here to discuss having a colonoscopy done. Her last one was about 17 years ago. This was done due to a history of irritable bowel syndrome and blood found in her stool. The exam was normal. She reports no current bowel problems. She reports that she uses the bathroom about 3-4 times a week. She uses Aloe water to manage any constipation. She is her today with her husband [place]. HPI  Past Medical History  Diagnosis Date  . Chicken pox   . Urinary incontinence   . Hyperlipidemia     takes Atorvastatin daily  . Arthritis   . Chronic neck pain     DDD/spondylosis/stenosis  . IBS (irritable bowel syndrome)   . Urinary frequency   . Urinary urgency   . Nocturia   . Dry eyes     dry eyes d/t use of contacts  . Insomnia     Past Surgical History  Procedure Laterality Date  . Carpal tunnel release    . Hammer toe    . Abdominal hysterectomy      30 years ago  . Lumbar [facility]amine[facility]  1989, [facility] Dr. Elesa Hackereaton  . Vaginal delivery    . Foot surgery      d/t hammer toe-bil  . Tubal ligation    . Back surgery      x 2  . Back pain      hx of buldging disc  . Colonoscopy  17 years ago  . Esophagogastroduodenoscopy    . Anterior cervical decomp/discectomy fusion  05/17/2012    Procedure: ANTERIOR CERVICAL DECOMPRESSION/DISCECTOMY FUSION 3 LEVELS;  Surgeon: Mariam DollarGary P Cram, MD;  Location: [facility];  Service: Neurosurgery;  Laterality: Bilateral;  Cervical three-four, four-five, five-six anterior cervical discectomy with fusion  . Shoulder arthroscopy Right 01/2013    Dr. Caryn BeeKevin Supple - [facility]    Family History  Problem Relation Age of Onset  . Heart disease Mother   . Diabetes Mother   . Heart disease Father   . Diabetes Father   . Diabetes Brother   . Depression Brother      Social History Social History  Substance Use Topics  . Smoking status: Former Games developermoker  . Smokeless tobacco: Never Used     Comment: quit smoking many yrs ago  . Alcohol Use: No    Allergies  Allergen Reactions  . Hydrocodone Itching  . Tramadol Itching  . Oxycodone Itching    Current Outpatient Prescriptions  Medication Sig Dispense Refill  . Aloe LIQD Take by mouth as needed.    Marland Kitchen. atorvastatin (LIPITOR) 20 MG tablet TAKE 1 TABLET BY MOUTH EVERY DAY 90 tablet 4  . cyclobenzaprine (FLEXERIL) 10 MG tablet Take 1 tablet (10 mg total) by mouth 3 (three) times daily as needed for muscle spasms. 90 tablet 1  . FLUoxetine (PROZAC) 20 MG capsule Take 1 capsule (20 mg total) by mouth daily. 90 capsule 1  . hydrochlorothiazide (HYDRODIURIL) 25 MG tablet TAKE 1 TABLET (25 MG TOTAL) BY MOUTH DAILY. 30 tablet 6  . lidocaine (LIDODERM) 5 % APPLY 1 PATCH ON SKIN DAILY, REMOVE AND DISCARD PATCH WITHIN 12 HOURS (12 HRS ON, 12 HRS OFF) 30 patch 6  . naproxen (NAPROSYN) 500 MG tablet Take 1 tablet (  500 mg total) by mouth 2 (two) times daily with a meal. 90 tablet 3  . temazepam (RESTORIL) 15 MG capsule Take 15 mg by mouth at bedtime as needed.   0  . polyethylene glycol powder (GLYCOLAX/MIRALAX) powder 255 grams one bottle for colonoscopy prep 255 g 0   No current facility-administered medications for this visit.    Review of Systems Review of Systems  Constitutional: Negative.   Respiratory: Negative.   Cardiovascular: Negative.   Gastrointestinal: Negative.     Blood pressure 110/78, pulse 68, resp. rate 13, height  (1.676 m), weight 175 lb 9.6 oz (79.652 kg).  Physical Exam Physical Exam  Constitutional: She is oriented to person, place, and time. She appears well-developed and well-nourished.  Eyes: Conjunctivae are normal. No scleral icterus.  Neck: Neck supple.    Cardiovascular: Normal rate, regular rhythm and normal heart sounds.   Pulmonary/Chest: Effort normal and  breath sounds normal.  Abdominal: Soft. Bowel sounds are normal. There is no hepatosplenomegaly. There is no tenderness.  Lymphadenopathy:    She has no cervical adenopathy.  Neurological: She is alert and oriented to person, place, and time.  Skin: Skin is warm and dry.  Psychiatric: She has a normal mood and affect.    Data Reviewed CBC and comprehensive metabolic panel dated 04/06/2015 were reviewed. Mild elevation of the glucose, first occurrence. Otherwise unremarkable.  Assessment    Candidate for screening colonoscopy.    Plan     Colonoscopy with possible biopsy/polypectomy prn: Information regarding the procedure, including its potential risks and complications (including but not limited to perforation of the bowel, which may require emergency surgery to repair, and bleeding) was verbally given to the patient. Educational information regarding lower intestinal endoscopy was given to the patient. Written instructions for how to complete the bowel prep using Miralax were provided. The importance of drinking ample fluids to avoid dehydration as a result of the prep emphasized.  The patient may continue her anti-inflammatory prior to the procedure.  Patient has been scheduled for a colonoscopy on 05-22-15 at [facility].     PCP: Ronna Polio   Earline Mayotte 05/07/2015, 9:48 AM

## 2015-05-07 NOTE — H&P (Signed)
HPI  Melissa LeekBeverly M Patton is a 59 y.o. female here to discuss having a colonoscopy done. Her last one was about 17 years ago. This was done due to a history of irritable bowel syndrome and blood found in her stool. The exam was normal. She reports no current bowel problems. She reports that she uses the bathroom about 3-4 times a week. She uses Aloe water to manage any constipation. She is her today with her husband [place].  HPI  Past Medical History   Diagnosis  Date   .  Chicken pox    .  Urinary incontinence    .  Hyperlipidemia      takes Atorvastatin daily   .  Arthritis    .  Chronic neck pain      DDD/spondylosis/stenosis   .  IBS (irritable bowel syndrome)    .  Urinary frequency    .  Urinary urgency    .  Nocturia    .  Dry eyes      dry eyes d/t use of contacts   .  Insomnia     Past Surgical History   Procedure  Laterality  Date   .  Carpal tunnel release     .  Hammer toe     .  Abdominal hysterectomy       30 years ago   .  Lumbar laminectomy   1989, [facility]     [facility] Dr. Elesa Hackereaton   .  Vaginal delivery     .  Foot surgery       d/t hammer toe-bil   .  Tubal ligation     .  Back surgery       x 2   .  Back pain       hx of buldging disc   .  Colonoscopy   17 years ago   .  Esophagogastroduodenoscopy     .  Anterior cervical decomp/discectomy fusion   05/17/2012     Procedure: ANTERIOR CERVICAL DECOMPRESSION/DISCECTOMY FUSION 3 LEVELS; Surgeon: Mariam DollarGary P Cram, MD; Location: [facility]; Service: Neurosurgery; Laterality: Bilateral; Cervical three-four, four-five, five-six anterior cervical discectomy with fusion   .  Shoulder arthroscopy  Right  01/2013     Dr. Caryn BeeKevin Supple - [facility]    Family History   Problem  Relation  Age of Onset   .  Heart disease  Mother    .  Diabetes  Mother    .  Heart disease  Father    .  Diabetes  Father    .  Diabetes  Brother    .  Depression  Brother     Social History  Social History   Substance Use Topics   .   Smoking status:  Former Games developermoker   .  Smokeless tobacco:  Never Used      Comment: quit smoking many yrs ago   .  Alcohol Use:  No    Allergies   Allergen  Reactions   .  Hydrocodone  Itching   .  Tramadol  Itching   .  Oxycodone  Itching    Current Outpatient Prescriptions   Medication  Sig  Dispense  Refill   .  Aloe LIQD  Take by mouth as needed.     Marland Kitchen.  atorvastatin (LIPITOR) 20 MG tablet  TAKE 1 TABLET BY MOUTH EVERY DAY  90 tablet  4   .  cyclobenzaprine (FLEXERIL) 10 MG tablet  Take  1 tablet (10 mg total) by mouth 3 (three) times daily as needed for muscle spasms.  90 tablet  1   .  FLUoxetine (PROZAC) 20 MG capsule  Take 1 capsule (20 mg total) by mouth daily.  90 capsule  1   .  hydrochlorothiazide (HYDRODIURIL) 25 MG tablet  TAKE 1 TABLET (25 MG TOTAL) BY MOUTH DAILY.  30 tablet  6   .  lidocaine (LIDODERM) 5 %  APPLY 1 PATCH ON SKIN DAILY, REMOVE AND DISCARD PATCH WITHIN 12 HOURS (12 HRS ON, 12 HRS OFF)  30 patch  6   .  naproxen (NAPROSYN) 500 MG tablet  Take 1 tablet (500 mg total) by mouth 2 (two) times daily with a meal.  90 tablet  3   .  temazepam (RESTORIL) 15 MG capsule  Take 15 mg by mouth at bedtime as needed.   0   .  polyethylene glycol powder (GLYCOLAX/MIRALAX) powder  255 grams one bottle for colonoscopy prep  255 g  0    No current facility-administered medications for this visit.    Review of Systems  Review of Systems  Constitutional: Negative.  Respiratory: Negative.  Cardiovascular: Negative.  Gastrointestinal: Negative.   Blood pressure 110/78, pulse 68, resp. rate 13, height  (1.676 m), weight 175 lb 9.6 oz (79.652 kg).  Physical Exam  Physical Exam  Constitutional: She is oriented to person, place, and time. She appears well-developed and well-nourished.  Eyes: Conjunctivae are normal. No scleral icterus.  Neck: Neck supple.    Cardiovascular: Normal rate, regular rhythm and normal heart sounds.  Pulmonary/Chest: Effort normal and breath  sounds normal.  Abdominal: Soft. Bowel sounds are normal. There is no hepatosplenomegaly. There is no tenderness.  Lymphadenopathy:  She has no cervical adenopathy.  Neurological: She is alert and oriented to person, place, and time.  Skin: Skin is warm and dry.  Psychiatric: She has a normal mood and affect.   Data Reviewed  CBC and comprehensive metabolic panel dated 04/06/2015 were reviewed. Mild elevation of the glucose, first occurrence. Otherwise unremarkable.  Assessment   Candidate for screening colonoscopy.   Plan   Colonoscopy with possible biopsy/polypectomy prn: Information regarding the procedure, including its potential risks and complications (including but not limited to perforation of the bowel, which may require emergency surgery to repair, and bleeding) was verbally given to the patient. Educational information regarding lower intestinal endoscopy was given to the patient. Written instructions for how to complete the bowel prep using Miralax were provided. The importance of drinking ample fluids to avoid dehydration as a result of the prep emphasized.  The patient may continue her anti-inflammatory prior to the procedure.  Patient has been scheduled for a colonoscopy on 05-22-15 at [facility].   PCP: Ronna Polio  Earline Mayotte  05/07/2015, 9:48 AM

## 2015-05-07 NOTE — Patient Instructions (Addendum)
Colonoscopy A colonoscopy is an exam to look at the entire large intestine (colon). This exam can help find problems such as tumors, polyps, inflammation, and areas of bleeding. The exam takes about 1 hour.  [facility] KNOW ABOUT:   Any allergies you have.  All medicines you are taking, including vitamins, herbs, eye drops, creams, and over-the-counter medicines.  Previous problems you or members of your family have had with the use of anesthetics.  Any blood disorders you have.  Previous surgeries you have had.  Medical conditions you have. RISKS AND COMPLICATIONS  Generally, this is a safe procedure. However, as with any procedure, complications can occur. Possible complications include:  Bleeding.  Tearing or rupture of the colon wall.  Reaction to medicines given during the exam.  Infection (rare). BEFORE THE PROCEDURE   Ask your health care provider about changing or stopping your regular medicines.  You may be prescribed an oral bowel prep. This involves drinking a large amount of medicated liquid, starting the day before your procedure. The liquid will cause you to have multiple loose stools until your stool is almost clear or light green. This cleans out your colon in preparation for the procedure.  Do not eat or drink anything else once you have started the bowel prep, unless your health care provider tells you it is safe to do so.  Arrange for someone to drive you home after the procedure. PROCEDURE   You will be given medicine to help you relax (sedative).  You will lie on your side with your knees bent.  A long, flexible tube with a light and camera on the end (colonoscope) will be inserted through the rectum and into the colon. The camera sends video back to a computer screen as it moves through the colon. The colonoscope also releases carbon dioxide gas to inflate the colon. This helps your health care provider see the area better.  During  the exam, your health care provider may take a small tissue sample (biopsy) to be examined under a microscope if any abnormalities are found.  The exam is finished when the entire colon has been viewed. AFTER THE PROCEDURE   Do not drive for 24 hours after the exam.  You may have a small amount of blood in your stool.  You may pass moderate amounts of gas and have mild abdominal cramping or bloating. This is caused by the gas used to inflate your colon during the exam.  Ask when your test results will be ready and how you will get your results. Make sure you get your test results.   This information is not intended to replace advice given to you by your health care provider. Make sure you discuss any questions you have with your health care provider.   Document Released: 06/20/2000 Document Revised: 04/13/2013 Document Reviewed: 02/28/2013 Elsevier Interactive Patient Education [employer]2016 [employer].  Patient has been scheduled [facility] colon[facility]-15 [facility].

## 2015-05-22 ENCOUNTER — Encounter
Admission: RE | Disposition: A | Discharge status: Home / Self Care | Payer: Self-pay | Source: Ambulatory Visit | Attending: General Surgery

## 2015-05-22 ENCOUNTER — Ambulatory Visit
Admission: RE | Admit: 2015-05-22 | Discharge: 2015-05-22 | Disposition: A | Discharge status: Home / Self Care | Payer: Commercial Managed Care - HMO | Source: Ambulatory Visit | Attending: General Surgery | Admitting: General Surgery

## 2015-05-22 ENCOUNTER — Ambulatory Visit: Payer: Commercial Managed Care - HMO | Admitting: Anesthesiology

## 2015-05-22 ENCOUNTER — Encounter: Payer: Self-pay | Admitting: *Deleted

## 2015-05-22 DIAGNOSIS — Z87891 Personal history of nicotine dependence: Secondary | ICD-10-CM | POA: Insufficient documentation

## 2015-05-22 DIAGNOSIS — R350 Frequency of micturition: Secondary | ICD-10-CM | POA: Diagnosis not present

## 2015-05-22 DIAGNOSIS — Z79899 Other long term (current) drug therapy: Secondary | ICD-10-CM | POA: Diagnosis not present

## 2015-05-22 DIAGNOSIS — I10 Essential (primary) hypertension: Secondary | ICD-10-CM | POA: Diagnosis not present

## 2015-05-22 DIAGNOSIS — E785 Hyperlipidemia, unspecified: Secondary | ICD-10-CM | POA: Insufficient documentation

## 2015-05-22 DIAGNOSIS — Z1211 Encounter for screening for malignant neoplasm of colon: Secondary | ICD-10-CM | POA: Insufficient documentation

## 2015-05-22 DIAGNOSIS — H04123 Dry eye syndrome of bilateral lacrimal glands: Secondary | ICD-10-CM | POA: Insufficient documentation

## 2015-05-22 DIAGNOSIS — G4700 Insomnia, unspecified: Secondary | ICD-10-CM | POA: Insufficient documentation

## 2015-05-22 DIAGNOSIS — R32 Unspecified urinary incontinence: Secondary | ICD-10-CM | POA: Insufficient documentation

## 2015-05-22 DIAGNOSIS — R3915 Urgency of urination: Secondary | ICD-10-CM | POA: Diagnosis not present

## 2015-05-22 DIAGNOSIS — M1990 Unspecified osteoarthritis, unspecified site: Secondary | ICD-10-CM | POA: Diagnosis not present

## 2015-05-22 DIAGNOSIS — K219 Gastro-esophageal reflux disease without esophagitis: Secondary | ICD-10-CM | POA: Insufficient documentation

## 2015-05-22 DIAGNOSIS — K589 Irritable bowel syndrome without diarrhea: Secondary | ICD-10-CM | POA: Insufficient documentation

## 2015-05-22 HISTORY — PX: COLONOSCOPY WITH PROPOFOL: SHX5780

## 2015-05-22 SURGERY — COLONOSCOPY WITH PROPOFOL
Anesthesia: General

## 2015-05-22 MED ORDER — PROPOFOL 10 MG/ML IV BOLUS
INTRAVENOUS | Status: DC | PRN
  Administered 2015-05-22: 40 mg via INTRAVENOUS

## 2015-05-22 MED ORDER — PHENYLEPHRINE HCL 10 MG/ML IJ SOLN
INTRAMUSCULAR | Status: DC | PRN
  Administered 2015-05-22: 100 ug via INTRAVENOUS

## 2015-05-22 MED ORDER — SODIUM CHLORIDE 0.9 % IV SOLN
INTRAVENOUS | Status: DC
  Administered 2015-05-22: 1000 mL via INTRAVENOUS

## 2015-05-22 MED ORDER — PROPOFOL 500 MG/50ML IV EMUL
INTRAVENOUS | Status: DC | PRN
  Administered 2015-05-22: 140 ug/kg/min via INTRAVENOUS

## 2015-05-22 MED ORDER — LIDOCAINE HCL (CARDIAC) 20 MG/ML IV SOLN
INTRAVENOUS | Status: DC | PRN
  Administered 2015-05-22: 60 mg via INTRAVENOUS

## 2015-05-22 NOTE — Op Note (Signed)
[facility] Patient Name: Melissa LangeBeverly Fichter Procedure Date: 05/22/2015 1:47 PM MRN: [id number] Account #: [id number] Date of Birth: 1956/02/24 Admit Type: Outpatient Age: [age] [facility] ROOM 1 Gender: Female Note Status: Finalized Procedure:         Colonoscopy Indications:       Screening for colorectal malignant neoplasm Providers:         Earline MayotteJeffrey W. Aigner Horseman, MD Referring MD:      Ginette PitmanJennifer A. Dan HumphreysWalker, MD (Referring MD) Medicines:         Monitored Anesthesia Care Complications:     No immediate complications. Procedure:         Pre-Anesthesia Assessment:                    - Prior to the procedure, a History and Physical was                     performed, and patient medications, allergies and                     sensitivities were reviewed. The patient's tolerance of                     previous anesthesia was reviewed.                    - The risks and benefits of the procedure and the sedation                     options and risks were discussed with the patient. All                     questions were answered and informed consent was obtained.                    After obtaining informed consent, the colonoscope was                     passed under direct vision. Throughout the procedure, the                     patient's blood pressure, pulse, and oxygen saturations                     were monitored continuously. The Colonoscope was                     introduced through the anus and advanced to the the                     terminal ileum. The colonoscopy was performed without                     difficulty. The patient tolerated the procedure well. The                     quality of the bowel preparation was excellent. Findings:      The entire examined colon appeared normal on direct and retroflexion       views. Impression:        - The entire examined colon is normal on direct and                     retroflexion views.        - No  specimens collected. Recommendation:    - Repeat colonoscopy in 10 years for screening purposes. Diagnosis Code(s): --- Professional ---                    Z12.11, Encounter for screening for malignant neoplasm of                     colon Earline Mayotte, MD 05/22/2015 2:12:16 PM This report has been signed electronically. Number of Addenda: 0 Note Initiated On: 05/22/2015 1:47 PM Scope Withdrawal Time: 0 hours 7 minutes 20 seconds  Total Procedure Duration: 0 hours 15 minutes 45 seconds       [facility]

## 2015-05-22 NOTE — Anesthesia Postprocedure Evaluation (Signed)
  Anesthesia Post-op Note  Patient: Melissa LeekBeverly M Meisinger  Procedure(s) Performed: Procedure(s): COLONOSCOPY WITH PROPOFOL (N/A)  Anesthesia type:General  Patient location: PACU  Post pain: Pain level controlled  Post assessment: Post-op Vital signs reviewed, Patient's Cardiovascular Status Stable, Respiratory Function Stable, Patent Airway and No signs of Nausea or vomiting  Post vital signs: Reviewed and stable  Last Vitals:  Filed Vitals:   05/22/15 1430  BP: 106/69  Pulse: 77  Temp:   Resp: 17    Level of consciousness: awake, alert  and patient cooperative  Complications: No apparent anesthesia complications

## 2015-05-22 NOTE — H&P (Signed)
Casimer LeekBeverly M [facility] [id number] 1955-09-02     HPI: For screening colonoscopy. Tolerated prep well.   Prescriptions prior to admission  Medication Sig Dispense Refill Last Dose  . Aloe LIQD Take by mouth as needed.   Past Week at Unknown time  . atorvastatin (LIPITOR) 20 MG tablet TAKE 1 TABLET BY MOUTH EVERY DAY 90 tablet 4 05/21/2015 at Unknown time  . cyclobenzaprine (FLEXERIL) 10 MG tablet Take 1 tablet (10 mg total) by mouth 3 (three) times daily as needed for muscle spasms. 90 tablet 1 05/21/2015 at Unknown time  . FLUoxetine (PROZAC) 20 MG capsule Take 1 capsule (20 mg total) by mouth daily. 90 capsule 1 05/21/2015 at Unknown time  . hydrochlorothiazide (HYDRODIURIL) 25 MG tablet TAKE 1 TABLET (25 MG TOTAL) BY MOUTH DAILY. 30 tablet 6 05/22/2015 at Unknown time  . lidocaine (LIDODERM) 5 % APPLY 1 PATCH ON SKIN DAILY, REMOVE AND DISCARD PATCH WITHIN 12 HOURS (12 HRS ON, 12 HRS OFF) 30 patch 6 05/21/2015 at Unknown time  . naproxen (NAPROSYN) 500 MG tablet Take 1 tablet (500 mg total) by mouth 2 (two) times daily with a meal. 90 tablet 3 05/21/2015 at Unknown time  . polyethylene glycol powder (GLYCOLAX/MIRALAX) powder 255 grams one bottle for colonoscopy prep 255 g 0 05/21/2015 at Unknown time  . temazepam (RESTORIL) 15 MG capsule Take 15 mg by mouth at bedtime as needed.   0 05/21/2015 at Unknown time   Allergies  Allergen Reactions  . Hydrocodone Itching  . Tramadol Itching  . Oxycodone Itching   Past Medical History  Diagnosis Date  . Chicken pox   . Urinary incontinence   . Hyperlipidemia     takes Atorvastatin daily  . Arthritis   . Chronic neck pain     DDD/spondylosis/stenosis  . IBS (irritable bowel syndrome)   . Urinary frequency   . Urinary urgency   . Nocturia   . Dry eyes     dry eyes d/t use of contacts  . Insomnia    Past Surgical History  Procedure Laterality Date  . Carpal tunnel release    . Hammer toe    . Abdominal hysterectomy      30 years ago  .  Lumbar [facility]aminectomy  1989, [facility] Dr. Elesa Hackereaton  . Vaginal delivery    . Foot surgery      d/t hammer toe-bil  . Tubal ligation    . Back surgery      x 2  . Back pain      hx of buldging disc  . Colonoscopy  17 years ago  . Esophagogastroduodenoscopy    . Anterior cervical decomp/discectomy fusion  05/17/2012    Procedure: ANTERIOR CERVICAL DECOMPRESSION/DISCECTOMY FUSION 3 LEVELS;  Surgeon: Mariam DollarGary P Cram, MD;  Location: [facility];  Service: Neurosurgery;  Laterality: Bilateral;  Cervical three-four, four-five, five-six anterior cervical discectomy with fusion  . Shoulder arthroscopy Right 01/2013    [facility]   Social History   Social History  . Marital Status: Married    Spouse Name: N/A  . Number of Children: N/A  . Years of Education: N/A   Occupational History  . Not on file.   Social History Main Topics  . Smoking status: Former Games developermoker  . Smokeless tobacco: Never Used     Comment: quit smoking many yrs ago  . Alcohol Use: No  . Drug Use: No  . Sexual Activity: Not Currently  Birth Control/ Protection: Surgical   Other Topics Concern  . Not on file   Social History Narrative   Lives in [place] with husband. Dog in home. 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 Dog in home. 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     PE: HEENT: Negative. Lungs: Clear. Cardio: RR. Earline Mayotte 05/22/2015   Assessment/Plan:  Proceed with planned endoscopy.

## 2015-05-22 NOTE — Transfer of Care (Signed)
Immediate Anesthesia Transfer of Care Note  Patient: Melissa Patton  Procedure(s) Performed: Procedure(s): COLONOSCOPY WITH PROPOFOL (N/A)  Patient Location: Endoscopy Unit  Anesthesia Type:General  Level of Consciousness: awake, alert , oriented and patient cooperative  Airway & Oxygen Therapy: Patient Spontanous Breathing and Patient connected to nasal cannula oxygen  Post-op Assessment: Report given to RN, Post -op Vital signs reviewed and stable and Patient moving all extremities X 4  Post vital signs: Reviewed and stable  Last Vitals:  Filed Vitals:   05/22/15 1308  BP: 140/79  Pulse: 97  Temp: 36.8 C  Resp: 20    Complications: No apparent anesthesia complications

## 2015-05-22 NOTE — Anesthesia Preprocedure Evaluation (Signed)
Anesthesia Evaluation  Patient identified by MRN, date of birth, ID band Patient awake    Reviewed: Allergy & Precautions, NPO status , Patient's Chart, lab work & pertinent test results  Airway Mallampati: II  TM Distance: >3 FB     Dental  (+) Upper Dentures, Lower Dentures   Pulmonary former smoker,    Pulmonary exam normal breath sounds clear to auscultation       Cardiovascular hypertension, Pt. on medications Normal cardiovascular exam     Neuro/Psych negative neurological ROS  negative psych ROS   GI/Hepatic Neg liver ROS, GERD  Medicated and Controlled,  Endo/Other  negative endocrine ROS  Renal/GU negative Renal ROS Bladder dysfunction      Musculoskeletal  (+) Arthritis , Osteoarthritis,    Abdominal Normal abdominal exam  (+)   Peds negative pediatric ROS (+)  Hematology negative hematology ROS (+)   Anesthesia Other Findings Urinary frequency/incontinence  Reproductive/Obstetrics                             Anesthesia Physical Anesthesia Plan  ASA: II  Anesthesia Plan: General   Post-op Pain Management:    Induction: Intravenous  Airway Management Planned: Nasal Cannula  Additional Equipment:   Intra-op Plan:   Post-operative Plan:   Informed Consent: I have reviewed the patients History and Physical, chart, labs and discussed the procedure including the risks, benefits and alternatives for the proposed anesthesia with the patient or authorized representative who has indicated his/her understanding and acceptance.   Dental advisory given  Plan Discussed with: CRNA and Surgeon  Anesthesia Plan Comments:         Anesthesia Quick Evaluation

## 2015-05-23 ENCOUNTER — Encounter: Payer: Self-pay | Admitting: General Surgery

## 2015-05-30 LAB — HM COLONOSCOPY

## 2015-06-17 ENCOUNTER — Other Ambulatory Visit: Payer: Self-pay | Admitting: Internal Medicine

## 2015-06-25 ENCOUNTER — Other Ambulatory Visit: Payer: Self-pay | Admitting: Internal Medicine

## 2015-09-13 ENCOUNTER — Other Ambulatory Visit: Payer: Self-pay

## 2015-09-13 MED ORDER — LIDOCAINE 5 % EX PTCH: MEDICATED_PATCH | CUTANEOUS | Status: DC

## 2015-09-13 MED ORDER — NAPROXEN 500 MG PO TABS: 500.0000 mg | ORAL_TABLET | Freq: Two times a day (BID) | ORAL | Status: DC

## 2015-09-13 MED ORDER — FLUOXETINE HCL 20 MG PO CAPS: 20.0000 mg | ORAL_CAPSULE | Freq: Every day | ORAL | Status: DC

## 2015-09-13 MED ORDER — ATORVASTATIN CALCIUM 20 MG PO TABS: 20.0000 mg | ORAL_TABLET | Freq: Every day | ORAL | Status: DC

## 2015-09-13 MED ORDER — CYCLOBENZAPRINE HCL 10 MG PO TABS: 10.0000 mg | ORAL_TABLET | Freq: Three times a day (TID) | ORAL | Status: DC | PRN

## 2015-09-13 MED ORDER — HYDROCHLOROTHIAZIDE 25 MG PO TABS: 25.0000 mg | ORAL_TABLET | Freq: Every day | ORAL | Status: DC

## 2015-09-17 ENCOUNTER — Telehealth: Payer: Self-pay

## 2015-09-17 NOTE — Telephone Encounter (Signed)
PA was denied for Lidocaine.  Please advise?

## 2015-09-17 NOTE — Telephone Encounter (Signed)
PA for Lidocaine completed on Cover my meds.

## 2015-09-18 NOTE — Telephone Encounter (Signed)
There is no alternative

## 2015-10-11 ENCOUNTER — Ambulatory Visit (INDEPENDENT_AMBULATORY_CARE_PROVIDER_SITE_OTHER): Payer: Commercial Managed Care - HMO | Admitting: Internal Medicine

## 2015-10-11 ENCOUNTER — Encounter: Payer: Self-pay | Admitting: Internal Medicine

## 2015-10-11 VITALS — BP 107/66 | HR 93 | Temp 98.3°F | Ht 66.0 in | Wt 178.5 lb

## 2015-10-11 DIAGNOSIS — M545 Low back pain: Secondary | ICD-10-CM | POA: Diagnosis not present

## 2015-10-11 DIAGNOSIS — E785 Hyperlipidemia, unspecified: Secondary | ICD-10-CM

## 2015-10-11 DIAGNOSIS — I10 Essential (primary) hypertension: Secondary | ICD-10-CM

## 2015-10-11 DIAGNOSIS — Z1239 Encounter for other screening for malignant neoplasm of breast: Secondary | ICD-10-CM | POA: Diagnosis not present

## 2015-10-11 DIAGNOSIS — G8929 Other chronic pain: Secondary | ICD-10-CM

## 2015-10-11 LAB — COMPREHENSIVE METABOLIC PANEL
ALK PHOS: 62 U/L (ref 39–117)
ALT: 23 U/L (ref 0–35)
AST: 22 U/L (ref 0–37)
Albumin: 4.2 g/dL (ref 3.5–5.2)
BILIRUBIN TOTAL: 0.8 mg/dL (ref 0.2–1.2)
BUN: 19 mg/dL (ref 6–23)
CO2: 31 meq/L (ref 19–32)
CREATININE: 0.94 mg/dL (ref 0.40–1.20)
Calcium: 9.8 mg/dL (ref 8.4–10.5)
Chloride: 98 mEq/L (ref 96–112)
GFR: 65.01 mL/min (ref >60.00)
GLUCOSE: 134 mg/dL — AB (ref 70–99)
Potassium: 4.1 mEq/L (ref 3.5–5.1)
Sodium: 135 mEq/L (ref 135–145)
TOTAL PROTEIN: 7.1 g/dL (ref 6.0–8.3)

## 2015-10-11 NOTE — Assessment & Plan Note (Signed)
BP Readings from Last 3 Encounters:  10/11/15 107/66  05/22/15 120/73  05/07/15 110/78   BP well controlled. Renal function with labs today. Continue HCTZ.

## 2015-10-11 NOTE — Assessment & Plan Note (Signed)
Symptoms well controlled with Naprosyn and Flexeril. Improved control with Lidoderm in past, however insurance has refused to cover this. Will send in an appeal.

## 2015-10-11 NOTE — Progress Notes (Signed)
Pre visit review using our clinic review tool, if applicable. No additional management support is needed unless otherwise documented below in the visit note. 

## 2015-10-11 NOTE — Telephone Encounter (Signed)
Do you want an appeal done?

## 2015-10-11 NOTE — Assessment & Plan Note (Signed)
Mammogram ordered

## 2015-10-11 NOTE — Progress Notes (Signed)
Subjective:    Patient ID: Melissa Patton, female    DOB: 02-15-1956, 60 y.o.   MRN: [id number]  HPI  59YO female presents for follow up.  Chronic back pain - No change in back pain. Using Naprosyn and Flexeril. Trying to get Lidoderm patch approved with insurance.  Aside from this, feeling well.  Wt Readings from Last 3 Encounters:  10/11/15 178 lb 8 oz (80.967 kg)  05/22/15 174 lb (78.926 kg)  05/07/15 175 lb 9.6 oz (79.652 kg)   BP Readings from Last 3 Encounters:  10/11/15 107/66  05/22/15 120/73  05/07/15 110/78    Past Medical History  Diagnosis Date  . Chicken pox   . Urinary incontinence   . Hyperlipidemia     takes Atorvastatin daily  . Arthritis   . Chronic neck pain     DDD/spondylosis/stenosis  . IBS (irritable bowel syndrome)   . Urinary frequency   . Urinary urgency   . Nocturia   . Dry eyes     dry eyes d/t use of contacts  . Insomnia    Family History  Problem Relation Age of Onset  . Heart disease Mother   . Diabetes Mother   . Heart disease Father   . Diabetes Father   . Diabetes Brother   . Depression Brother    Past Surgical History  Procedure Laterality Date  . Carpal tunnel release    . Hammer toe    . Abdominal hysterectomy      30 years ago  . Lumbar laminectomy  1989, 1991    [facility], Dr. Elesa Hacker  . Vaginal delivery    . Foot surgery      d/t hammer toe-bil  . Tubal ligation    . Back surgery      x 2  . Back pain      hx of buldging disc  . Colonoscopy  17 years ago  . Esophagogastroduodenoscopy    . Anterior cervical decomp/discectomy fusion  05/17/2012    Procedure: ANTERIOR CERVICAL DECOMPRESSION/DISCECTOMY FUSION 3 LEVELS;  Surgeon: Mariam Dollar, MD;  Location: [facility];  Service: Neurosurgery;  Laterality: Bilateral;  Cervical three-four, four-five, five-six anterior cervical discectomy with fusion  . Shoulder arthroscopy Right 01/2013    Dr. Caryn Bee Supple - [facility]  . Colonoscopy with propofol N/A  05/22/2015    Procedure: COLONOSCOPY WITH PROPOFOL;  Surgeon: Earline Mayotte, MD;  Location: [facility];  Service: Endoscopy;  Laterality: N/A;   Social History   Social History  . Marital Status: Married    Spouse Name: N/A  . Number of Children: N/A  . Years of Education: N/A   Social History Main Topics  . Smoking status: Former Games developer  . Smokeless tobacco: Never Used     Comment: quit smoking many yrs ago  . Alcohol Use: No  . Drug Use: No  . Sexual Activity: Not Currently    Birth Control/ Protection: Surgical   Other Topics Concern  . None   Social History Narrative   Lives in [place] with husband. Dog in home. 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  Eyes: Negative for visual disturbance.  Respiratory: Negative for cough and shortness of breath.   Cardiovascular: Negative for  chest pain, palpitations and leg swelling.  Gastrointestinal: Negative for nausea, vomiting, abdominal pain, diarrhea and constipation.  Musculoskeletal: Positive for myalgias, back pain and arthralgias.  Skin: Negative for color change and rash.  Neurological: Negative for weakness.  Hematological: Negative for adenopathy. Does not bruise/bleed easily.  Psychiatric/Behavioral: Negative for sleep disturbance and dysphoric mood. The patient is not nervous/anxious.        Objective:    BP 107/66 mmHg  Pulse 93  Temp(Src) 98.3 F (36.8 C) (Oral)  Ht 5\' 6"  (1.676 m)  Wt 178 lb 8 oz (80.967 kg)  BMI 28.82 kg/m2  SpO2 95% Physical Exam  Constitutional: She is oriented to person, place, and time. She appears well-developed and well-nourished. No distress.  HENT:  Head: Normocephalic and atraumatic.  Right Ear: External ear normal.  Left Ear: External ear normal.  Nose: Nose normal.  Mouth/Throat:  Oropharynx is clear and moist. No oropharyngeal exudate.  Eyes: Conjunctivae are normal. Pupils are equal, round, and reactive to light. Right eye exhibits no discharge. Left eye exhibits no discharge. No scleral icterus.  Neck: Normal range of motion. Neck supple. No tracheal deviation present. No thyromegaly present.  Cardiovascular: Normal rate, regular rhythm, normal heart sounds and intact distal pulses.  Exam reveals no gallop and no friction rub.   No murmur heard. Pulmonary/Chest: Effort normal and breath sounds normal. No respiratory distress. She has no wheezes. She has no rales. She exhibits no tenderness.  Musculoskeletal: Normal range of motion. She exhibits no edema or tenderness.  Lymphadenopathy:    She has no cervical adenopathy.  Neurological: She is alert and oriented to person, place, and time. No cranial nerve deficit. She exhibits normal muscle tone. Coordination normal.  Skin: Skin is warm and dry. No rash noted. She is not diaphoretic. No erythema. No pallor.  Psychiatric: She has a normal mood and affect. Her behavior is normal. Judgment and thought content normal.          Assessment & Plan:   Problem List Items Addressed This Visit      Unprioritized   Chronic low back pain - Primary    Symptoms well controlled with Naprosyn and Flexeril. Improved control with Lidoderm in past, however insurance has refused to cover this. Will send in an appeal.      HTN (hypertension)    BP Readings from Last 3 Encounters:  10/11/15 107/66  05/22/15 120/73  05/07/15 110/78   BP well controlled. Renal function with labs today. Continue HCTZ.      Relevant Orders   Comprehensive metabolic panel   Hyperlipidemia    Will check LFTs with labs today.      Screening for breast cancer    Mammogram ordered.      Relevant Orders   MM Digital Screening       Return in about 6 months (around 04/11/2016) for Recheck.  Ronna PolioJennifer Garett Tetzloff, MD Internal Medicine [facility]

## 2015-10-11 NOTE — Patient Instructions (Signed)
Labs today.  Follow up in 6 months. 

## 2015-10-11 NOTE — Telephone Encounter (Signed)
Appeal mailed

## 2015-10-11 NOTE — Telephone Encounter (Signed)
Yes, let's try. She has only partial control of pain with Naprosyn and Flexeril. Unable to tolerate narcotic pain medication.

## 2015-10-11 NOTE — Assessment & Plan Note (Signed)
Will check LFTs with labs today. 

## 2015-11-14 ENCOUNTER — Other Ambulatory Visit: Payer: Self-pay | Admitting: Internal Medicine

## 2016-01-28 ENCOUNTER — Other Ambulatory Visit: Payer: Self-pay | Admitting: Internal Medicine

## 2016-03-24 ENCOUNTER — Other Ambulatory Visit: Payer: Self-pay | Admitting: Family Medicine

## 2016-03-24 NOTE — Telephone Encounter (Signed)
Refilled 09/13/15. Pt last seen by Dr.Walker 10/11/15. Pt has a appt with Dr.Cook on 04/11/16. Please advise?

## 2016-03-25 MED ORDER — CYCLOBENZAPRINE HCL 10 MG PO TABS: 10.0000 mg | ORAL_TABLET | Freq: Three times a day (TID) | ORAL | 1 refills | Status: DC | PRN

## 2016-04-11 ENCOUNTER — Ambulatory Visit: Payer: Commercial Managed Care - HMO | Admitting: Internal Medicine

## 2016-04-11 ENCOUNTER — Ambulatory Visit: Payer: Commercial Managed Care - HMO | Admitting: Family Medicine

## 2016-04-17 ENCOUNTER — Ambulatory Visit (INDEPENDENT_AMBULATORY_CARE_PROVIDER_SITE_OTHER): Payer: Commercial Managed Care - HMO | Admitting: Family Medicine

## 2016-04-17 ENCOUNTER — Encounter: Payer: Self-pay | Admitting: Family Medicine

## 2016-04-17 ENCOUNTER — Ambulatory Visit: Payer: Commercial Managed Care - HMO | Admitting: Family Medicine

## 2016-04-17 VITALS — BP 118/72 | HR 93 | Temp 98.4°F | Resp 18 | Wt 174.8 lb

## 2016-04-17 DIAGNOSIS — I10 Essential (primary) hypertension: Secondary | ICD-10-CM | POA: Diagnosis not present

## 2016-04-17 DIAGNOSIS — M545 Low back pain: Secondary | ICD-10-CM

## 2016-04-17 DIAGNOSIS — T7840XA Allergy, unspecified, initial encounter: Secondary | ICD-10-CM | POA: Insufficient documentation

## 2016-04-17 DIAGNOSIS — G4700 Insomnia, unspecified: Secondary | ICD-10-CM | POA: Insufficient documentation

## 2016-04-17 DIAGNOSIS — G8929 Other chronic pain: Secondary | ICD-10-CM

## 2016-04-17 DIAGNOSIS — E785 Hyperlipidemia, unspecified: Secondary | ICD-10-CM | POA: Diagnosis not present

## 2016-04-17 DIAGNOSIS — R42 Dizziness and giddiness: Secondary | ICD-10-CM

## 2016-04-17 LAB — LIPID PANEL
CHOL/HDL RATIO: 4
CHOLESTEROL: 226 mg/dL — AB (ref 0–200)
HDL: 50.4 mg/dL (ref >39.00)
LDL Cholesterol: 143 mg/dL — ABNORMAL HIGH (ref 0–99)
NonHDL: 175.24
TRIGLYCERIDES: 163 mg/dL — AB (ref 0.0–149.0)
VLDL: 32.6 mg/dL (ref 0.0–40.0)

## 2016-04-17 MED ORDER — LIDOCAINE 5 % EX PTCH: MEDICATED_PATCH | CUTANEOUS | 6 refills | Status: DC

## 2016-04-17 NOTE — Progress Notes (Signed)
Pre visit review using our clinic review tool, if applicable. No additional management support is needed unless otherwise documented below in the visit note. 

## 2016-04-17 NOTE — Patient Instructions (Signed)
We are arranging the MRI.  Lidocaine refilled.  Continue your other medications.  You may use an OTC antihistamine.  We will arrange follow up following MRI.  Take care  Dr. Adriana Simasook

## 2016-04-17 NOTE — Assessment & Plan Note (Signed)
Okay to use OTC antihistamine 

## 2016-04-17 NOTE — Progress Notes (Signed)
Subjective:  Patient ID: Melissa Patton, female    DOB: [birth year]  Age: 60 y.o. MRN: [id number]  CC: Follow up   HPI:  60 year old female with hypertension, hyperlipidemia, insomnia, chronic low back pain presents for follow-up. In addition to her chronic medical conditions, she has additional concerns today. See below  HTN  Well controlled on HCTZ.  HLD  Not a goal. Most recent LDL was 106.  Is currently on Lipitor 20 mg daily.  Lipid panel needed.  Allergies  Patient has recently been experiencing allergies secondary to weather change.  She would like to take an antihistamine but wants to make sure it's okay with her other medications.  Will discuss today.  Chronic low back pain  Stable.  Requesting refill on lidocaine patches.  Dizziness  Patient states that she's been expansion dizziness for the past week.  She states that it occurs only with walking or with riding a bike.  She states it occurs predominantly with walking. She's been trying to walk regularly for exercise.  She states that she walks in as she comes. She feels as if she is going to fall. He states that she feels off balance.  Improves at rest.  Patient has had low back and cervical surgery. She reports hand numbness and tingling.  Patient also reports that she has a baseline tremor of the hands.  No reports of vision changes.  No new medications.  Social Hx   Social History   Social History  . Marital status: Married    Spouse name: N/A  . Number of children: N/A  . Years of education: N/A   Social History Main Topics  . Smoking status: Former Games developer  . Smokeless tobacco: Never Used     Comment: quit smoking many yrs ago  . Alcohol use No  . Drug use: No  . Sexual activity: Not Currently    Birth control/ protection: Surgical   Other Topics Concern  . None   Social History Narrative   Lives in [place] with husband. Dog in home. 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   Musculoskeletal: Positive for back pain and neck pain.  Neurological: Positive for dizziness, tremors and numbness.       Imbalance.   Objective:  BP 118/72 (BP Location: Left Arm, Patient Position: Sitting, Cuff Size: Normal)   Pulse 93   Temp 98.4 F (36.9 C) (Oral)   Resp 18   Wt 174 lb 12 oz (79.3 kg)   SpO2 97%   BMI 28.21 kg/m   BP/Weight 04/17/2016 10/11/2015 05/22/2015  Systolic BP 118 107 120  Diastolic BP 72 66 73  Wt. (Lbs) 174.75 178.5 174  BMI 28.21 28.82 28.1   Physical Exam  Constitutional: She appears well-developed. No distress.  Cardiovascular: Normal rate and regular rhythm.   Pulmonary/Chest: Effort normal and breath sounds normal.  Neurological: She is alert.  EOM appear intact but patient has some difficulty with this.  Rapid alternating movements - Finger tapping slow. Supination/pronation of the hand slow and incomplete.  Tremor noted, worse with intention. Rhomberg negative.  Psychiatric: She has a normal mood and affect.  Vitals reviewed.  Lab Results  Component Value Date   WBC 6.6 04/06/2015   HGB 13.6 04/06/2015   HCT 41.6 04/06/2015   PLT  304.0 04/06/2015   GLUCOSE 134 (H) 10/11/2015   CHOL 185 04/06/2015   TRIG 100.0 04/06/2015   HDL 59.70 04/06/2015   LDLDIRECT 230.5 04/14/2012   LDLCALC 106 (H) 04/06/2015   ALT 23 10/11/2015   AST 22 10/11/2015   NA 135 10/11/2015   K 4.1 10/11/2015   CL 98 10/11/2015   CREATININE 0.94 10/11/2015   BUN 19 10/11/2015   CO2 31 10/11/2015   TSH 1.73 04/06/2015   MICROALBUR 0.2 03/21/2014   Assessment & Plan:   Problem List Items Addressed This Visit    Allergy    Okay to use OTC antihistamine.      Chronic low back pain    Stable. Refilling Lidocaine patches.      Equilibrium disorder    New problem. Patient appears to have disequilibrium, etiology of which and  prognosis is unclear. No other features of presyncope or vertigo. Neurological exam notable for abnormal rapid alternating movements and tremor. Patient also has a history of cervical disease and fusion. Cervical myelopathy and cerebellar issues can be the cause of disequilibrium. Given exam findings, arranging for MRI of the brain and cervical spine. I offered her referral to neurology and she declined.      Relevant Orders   MR Brain Wo Contrast   MR Cervical Spine Wo Contrast   HTN (hypertension) - Primary    Stable. Continue HCTZ.      Hyperlipidemia    Not at goal. Lipid panel today. Continue lipitor. May need increase based on lipid panel.      Relevant Orders   Lipid Profile    Other Visit Diagnoses   None.    Meds ordered this encounter  Medications  . lidocaine (LIDODERM) 5 %    Sig: APPLY 1 PATCH ON SKIN DAILY, REMOVE AND DISCARD PATCH WITHIN 12 HOURS (12 HRS ON, 12 HRS OFF)    Dispense:  30 patch    Refill:  6    Follow-up: Pending MRI  Everlene OtherJayce Ellinore Merced DO [facility] [place]

## 2016-04-17 NOTE — Assessment & Plan Note (Addendum)
New problem. Patient appears to have disequilibrium, etiology of which and prognosis is unclear. No other features of presyncope or vertigo. Neurological exam notable for abnormal rapid alternating movements and tremor. Patient also has a history of cervical disease and fusion. Cervical myelopathy and cerebellar issues can be the cause of disequilibrium. Given exam findings, arranging for MRI of the brain and cervical spine. I offered her referral to neurology and she declined.

## 2016-04-17 NOTE — Assessment & Plan Note (Signed)
Not at goal. Lipid panel today. Continue lipitor. May need increase based on lipid panel.

## 2016-04-17 NOTE — Assessment & Plan Note (Signed)
Stable. Continue HCTZ. 

## 2016-04-17 NOTE — Assessment & Plan Note (Signed)
Stable. Refilling Lidocaine patches.

## 2016-04-19 ENCOUNTER — Ambulatory Visit
Admission: RE | Admit: 2016-04-19 | Discharge: 2016-04-19 | Disposition: A | Discharge status: Home / Self Care | Payer: Commercial Managed Care - HMO | Source: Ambulatory Visit | Attending: Family Medicine | Admitting: Family Medicine

## 2016-04-19 DIAGNOSIS — Z981 Arthrodesis status: Secondary | ICD-10-CM | POA: Diagnosis not present

## 2016-04-19 DIAGNOSIS — M542 Cervicalgia: Secondary | ICD-10-CM | POA: Diagnosis not present

## 2016-04-19 DIAGNOSIS — M4802 Spinal stenosis, cervical region: Secondary | ICD-10-CM | POA: Insufficient documentation

## 2016-04-19 DIAGNOSIS — R200 Anesthesia of skin: Secondary | ICD-10-CM | POA: Diagnosis not present

## 2016-04-19 DIAGNOSIS — R9082 White matter disease, unspecified: Secondary | ICD-10-CM | POA: Diagnosis not present

## 2016-04-19 DIAGNOSIS — R42 Dizziness and giddiness: Secondary | ICD-10-CM | POA: Insufficient documentation

## 2016-04-21 ENCOUNTER — Telehealth: Payer: Self-pay | Admitting: *Deleted

## 2016-04-21 NOTE — Telephone Encounter (Signed)
See result note.  

## 2016-04-21 NOTE — Telephone Encounter (Signed)
Pt stated that she had a MRI on Saturday, she stated that she received a call,that could possibly be in regards to her results  Pt contact [phone]

## 2016-04-29 ENCOUNTER — Telehealth: Payer: Self-pay

## 2016-04-29 NOTE — Telephone Encounter (Signed)
PA for Lidocaine patches

## 2016-05-16 ENCOUNTER — Other Ambulatory Visit: Payer: Self-pay | Admitting: Family Medicine

## 2016-05-16 NOTE — Telephone Encounter (Signed)
Refilled 03/25/16. Pt last seen 04/17/16. Please advise?

## 2016-06-12 ENCOUNTER — Telehealth: Payer: Self-pay | Admitting: Family Medicine

## 2016-06-12 MED ORDER — FLUOXETINE HCL 20 MG PO CAPS
20.0000 mg | ORAL_CAPSULE | Freq: Every day | ORAL | 2 refills | Status: DC
Start: 2016-06-12 — End: 2017-03-16

## 2016-06-12 MED ORDER — HYDROCHLOROTHIAZIDE 25 MG PO TABS: 25.0000 mg | ORAL_TABLET | Freq: Every day | ORAL | 3 refills | Status: DC

## 2016-06-12 NOTE — Telephone Encounter (Signed)
Pt stated that her [employer] sent her a letter stating we did not approve her HCTZ or prozac. Pt was informed that from what it says in her chart we did not received any refill request. Medication reordered and sent.

## 2016-06-12 NOTE — Telephone Encounter (Signed)
Pt lvm stating that she has questions about her medications. She did not state what medications. This is all she said. Pt cb [phone]

## 2016-07-11 ENCOUNTER — Telehealth: Payer: Self-pay | Admitting: *Deleted

## 2016-07-11 MED ORDER — ATORVASTATIN CALCIUM 40 MG PO TABS: 40.0000 mg | ORAL_TABLET | Freq: Every day | ORAL | 3 refills | Status: DC

## 2016-07-11 NOTE — Telephone Encounter (Signed)
Refill was sent. Per lab note there was a dose change to 40 mg.

## 2016-07-11 NOTE — Telephone Encounter (Signed)
Pt has requested a medication refill for atorvastatin ,90day supply  [facility]

## 2016-07-19 ENCOUNTER — Other Ambulatory Visit: Payer: Self-pay | Admitting: Family Medicine

## 2016-07-21 NOTE — Telephone Encounter (Signed)
Pt last refill on Flexeril was on 06/17/16 and last OV and labs were 04/17/16. Pt has no future scheduled appts., ok to refill?

## 2016-08-12 ENCOUNTER — Ambulatory Visit (INDEPENDENT_AMBULATORY_CARE_PROVIDER_SITE_OTHER): Payer: Commercial Managed Care - HMO

## 2016-08-12 VITALS — BP 110/68 | HR 105 | Temp 98.4°F | Resp 14 | Ht 65.0 in | Wt 175.0 lb

## 2016-08-12 DIAGNOSIS — Z0000 Encounter for general adult medical examination without abnormal findings: Secondary | ICD-10-CM

## 2016-08-12 DIAGNOSIS — Z1231 Encounter for screening mammogram for malignant neoplasm of breast: Secondary | ICD-10-CM | POA: Diagnosis not present

## 2016-08-12 DIAGNOSIS — Z1239 Encounter for other screening for malignant neoplasm of breast: Secondary | ICD-10-CM

## 2016-08-12 NOTE — Progress Notes (Signed)
Care was provided under my supervision. I agree with the management as indicated in the note.  Alem Fahl DO  

## 2016-08-12 NOTE — Patient Instructions (Addendum)
Melissa Patton , Thank you for taking time to come for your Medicare Wellness Visit. I appreciate your ongoing commitment to your health goals. Please review the following plan we discussed and let me know if I can assist you in the future.   Follow up with Dr. Adriana Simas as needed.  These are the goals we discussed: Goals    . Healthy Lifestyle          Stay hydrated and drink plenty of fluids/water Stay active and continue to exercise Low carb foods.  Educational material provided       This is a list of the screening recommended for you and due dates:  Health Maintenance  Topic Date Due  .  Hepatitis C: One time screening is recommended by Center for Disease Control  (CDC) for  adults born from [birth year] through 1963.   03-29-1956  . HIV Screening  10/15/1972  . Tetanus Vaccine  10/15/1976  . Mammogram  04/19/2015  . Flu Shot  10/04/2016*  . Pap Smear  03/21/2017  . Colon Cancer Screening  05/29/2025  *Topic was postponed. The date shown is not the original due date.      Fall Prevention in the Home Introduction Falls can cause injuries. They can happen to people of all ages. There are many things you can do to make your home safe and to help prevent falls. What can I do on the outside of my home?  Regularly fix the edges of walkways and driveways and fix any cracks.  Remove anything that might make you trip as you walk through a door, such as a raised step or threshold.  Trim any bushes or trees on the path to your home.  Use [place] outdoor lighting.  Clear any walking paths of anything that might make someone trip, such as rocks or tools.  Regularly check to see if handrails are loose or broken. Make sure that both sides of any steps have handrails.  Any raised decks and porches should have guardrails on the edges.  Have any leaves, snow, or ice cleared regularly.  Use sand or salt on walking paths during winter.  Clean up any spills in your garage right away. This  includes oil or grease spills. What can I do in the bathroom?  Use night lights.  Install grab bars by the toilet and in the tub and shower. Do not use towel bars as grab bars.  Use non-skid mats or decals in the tub or shower.  If you need to sit down in the shower, use a plastic, non-slip stool.  Keep the floor dry. Clean up any water that spills on the floor as soon as it happens.  Remove soap buildup in the tub or shower regularly.  Attach bath mats securely with double-sided non-slip rug tape.  Do not have throw rugs and other things on the floor that can make you trip. What can I do in the bedroom?  Use night lights.  Make sure that you have a light by your bed that is easy to reach.  Do not use any sheets or blankets that are too big for your bed. They should not hang down onto the floor.  Have a firm chair that has side arms. You can use this for support while you get dressed.  Do not have throw rugs and other things on the floor that can make you trip. What can I do in the kitchen?  Clean up any spills right away.  Avoid walking on wet floors.  Keep items that you use a lot in easy-to-reach places.  If you need to reach something above you, use a strong step stool that has a grab bar.  Keep electrical cords out of the way.  Do not use floor polish or wax that makes floors slippery. If you must use wax, use non-skid floor wax.  Do not have throw rugs and other things on the floor that can make you trip. What can I do with my stairs?  Do not leave any items on the stairs.  Make sure that there are handrails on both sides of the stairs and use them. Fix handrails that are broken or loose. Make sure that handrails are as long as the stairways.  Check any carpeting to make sure that it is firmly attached to the stairs. Fix any carpet that is loose or worn.  Avoid having throw rugs at the top or bottom of the stairs. If you do have throw rugs, attach them to the  floor with carpet tape.  Make sure that you have a light switch at the top of the stairs and the bottom of the stairs. If you do not have them, ask someone to add them for you. What else can I do to help prevent falls?  Wear shoes that:  Do not have high heels.  Have rubber bottoms.  Are comfortable and fit you well.  Are closed at the toe. Do not wear sandals.  If you use a stepladder:  Make sure that it is fully opened. Do not climb a closed stepladder.  Make sure that both sides of the stepladder are locked into place.  Ask someone to hold it for you, if possible.  Clearly mark and make sure that you can see:  Any grab bars or handrails.  First and last steps.  Where the edge of each step is.  Use tools that help you move around (mobility aids) if they are needed. These include:  Canes.  Walkers.  Scooters.  Crutches.  Turn on the lights when you go into a dark area. Replace any light bulbs as soon as they burn out.  Set up your furniture so you have a clear path. Avoid moving your furniture around.  If any of your floors are uneven, fix them.  If there are any pets around you, be aware of where they are.  Review your medicines with your doctor. Some medicines can make you feel dizzy. This can increase your chance of falling. Ask your doctor what other things that you can do to help prevent falls. This information is not intended to replace advice given to you by your health care provider. Make sure you discuss any questions you have with your health care provider. Document Released: 04/19/2009 Document Revised: 11/29/2015 Document Reviewed: 07/28/2014  2017 Elsevier

## 2016-08-12 NOTE — Progress Notes (Signed)
Subjective:   Melissa Patton is a 61 y.o. female who presents for an Initial Medicare Annual Wellness Visit.  Review of Systems    No ROS.  Medicare Wellness Visit.  Cardiac Risk Factors include: advanced age (>69men, >46 women);hypertension     Objective:    Today's Vitals   08/12/16 0814  BP: 110/68  Pulse: (!) 105  Resp: 14  Temp: 98.4 F (36.9 C)  TempSrc: Oral  SpO2: 98%  Weight: 175 lb (79.4 kg)  Height: 5\' 5"  (1.651 m)   Body mass index is 29.12 kg/m.   Current Medications (verified) Outpatient Encounter Prescriptions as of 08/12/2016  Medication Sig  . Aloe LIQD Take by mouth as needed.  Marland Kitchen atorvastatin (LIPITOR) 40 MG tablet Take 1 tablet (40 mg total) by mouth daily.  . cyclobenzaprine (FLEXERIL) 10 MG tablet TAKE 1 TABLET THREE TIMES DAILY AS NEEDED FOR MUSCLE SPASM(S)  . FLUoxetine (PROZAC) 20 MG capsule Take 1 capsule (20 mg total) by mouth daily.  . hydrochlorothiazide (HYDRODIURIL) 25 MG tablet Take 1 tablet (25 mg total) by mouth daily.  . naproxen (NAPROSYN) 500 MG tablet TAKE 1 TABLET TWICE DAILY WITH MEALS  . polyethylene glycol powder (GLYCOLAX/MIRALAX) powder 255 grams one bottle for colonoscopy prep  . temazepam (RESTORIL) 15 MG capsule Take 15 mg by mouth at bedtime as needed.   . lidocaine (LIDODERM) 5 % APPLY 1 PATCH ON SKIN DAILY, REMOVE AND DISCARD PATCH WITHIN 12 HOURS (12 HRS ON, 12 HRS OFF) (Patient not taking: Reported on 08/12/2016)   No facility-administered encounter medications on file as of 08/12/2016.     Allergies (verified) Hydrocodone; Tramadol; and Oxycodone   History: Past Medical History:  Diagnosis Date  . Arthritis   . Chicken pox   . Chronic neck pain    DDD/spondylosis/stenosis  . Dry eyes    dry eyes d/t use of contacts  . Hyperlipidemia    takes Atorvastatin daily  . IBS (irritable bowel syndrome)   . Insomnia   . Nocturia   . Urinary frequency   . Urinary incontinence   . Urinary urgency    Past Surgical  History:  Procedure Laterality Date  . ABDOMINAL HYSTERECTOMY     30 years ago  . ANTERIOR CERVICAL DECOMP/DISCECTOMY FUSION  05/17/2012   Procedure: ANTERIOR CERVICAL DECOMPRESSION/DISCECTOMY FUSION 3 LEVELS;  Surgeon: Mariam Dollar, MD;  Location: [facility];  Service: Neurosurgery;  Laterality: Bilateral;  Cervical three-four, four-five, five-six anterior cervical discectomy with fusion  . back pain     hx of buldging disc  . BACK SURGERY     x 2  . CARPAL TUNNEL RELEASE    . COLONOSCOPY  17 years ago  . COLONOSCOPY WITH PROPOFOL N/A 05/22/2015   Procedure: COLONOSCOPY WITH PROPOFOL;  Surgeon: Earline Mayotte, MD;  Location: [facility] ENDOSCOPY;  Service: Endoscopy;  Laterality: N/A;  . ESOPHAGOGASTRODUODENOSCOPY    . FOOT SURGERY     d/t hammer toe-bil  . hammer toe    . LUMBAR LAMINECTOMY  1989, 1991   [facility], Dr. Elesa Hacker  . SHOULDER ARTHROSCOPY Right 01/2013   Dr. Caryn Bee Supple - [facility]  . TUBAL LIGATION    . VAGINAL DELIVERY     Family History  Problem Relation Age of Onset  . Heart disease Mother   . Diabetes Mother   . Heart disease Father   . Diabetes Father   . Diabetes Brother   . Depression Brother    Social  History   Occupational History  . Not on file.   Social History Main Topics  . Smoking status: Former Games developer  . Smokeless tobacco: Never Used     Comment: quit smoking many yrs ago  . Alcohol use No  . Drug use: No  . Sexual activity: Not Currently    Birth control/ protection: Surgical    Tobacco Counseling Counseling given: Not Answered   Activities of Daily Living In your present state of health, do you have any difficulty performing the following activities: 08/12/2016  Hearing? N  Vision? N  Difficulty concentrating or making decisions? Y  Walking or climbing stairs? Y  Dressing or bathing? N  Doing errands, shopping? N  Preparing Food and eating ? N  Using the Toilet? N  In the past six months, have you accidently leaked  urine? N  Do you have problems with loss of bowel control? N  Managing your Medications? N  Managing your Finances? N  Housekeeping or managing your Housekeeping? Y  Some recent data might be hidden    Immunizations and Health Maintenance Immunization History  Administered Date(s) Administered  . Influenza,inj,Quad PF,36+ Mos 04/06/2015   Health Maintenance Due  Topic Date Due  . Hepatitis C Screening  [birth year]  . HIV Screening  10/15/1972  . TETANUS/TDAP  10/15/1976  . MAMMOGRAM  04/19/2015    Patient Care Team: Tommie Sams, DO as PCP - General (Family Medicine)  Indicate any recent Medical Services you may have received from other than [facility] providers in the past year (date may be approximate).     Assessment:   This is a routine wellness examination for [name].  The goal of the wellness visit is to assist the patient how to close the gaps in care and create a preventative care plan for the patient.   Taking calcium VIT D as appropriate/Osteoporosis risk reviewed.  Medications reviewed; taking without issues or barriers.  Safety issues reviewed; smoke detectors in the home. No firearms in the home. Wears seatbelts when driving or riding with others. No violence in the home.  No identified risk were noted; The patient was oriented x 3; appropriate in dress and manner and no objective failures at ADL's or IADL's.   BMI; discussed the importance of a healthy diet, water intake and exercise. Educational material provided.  HTN; followed by PCP.  Mammogram ordered; follow as directed.    Patient Concerns: None at this time. Follow up with PCP as needed.  Hearing/Vision screen Hearing Screening Comments: Patient passes the whisper test Vision Screening Comments: Followed by Dr. Alvester Morin Wears glasses Visual acuity not assessed per patient preference since she has regular follow up with her ophthalmologist  Dietary issues and exercise activities  discussed: Current Exercise Habits: Home exercise routine, Type of exercise: walking, Time (Minutes): 45, Frequency (Times/Week): 3, Weekly Exercise (Minutes/Week): 135, Intensity: Moderate  Goals    . Healthy Lifestyle          Stay hydrated and drink plenty of fluids/water Stay active and continue to exercise Low carb foods.  Educational material provided      Depression Screen PHQ 2/9 Scores 08/12/2016  PHQ - 2 Score 0    Fall Risk Fall Risk  08/12/2016  Falls in the past year? Yes  Number falls in past yr: 1  Injury with Fall? No    Cognitive Function:     6CIT Screen 08/12/2016  What Year? 0 points  What month? 0 points  What time?  0 points  Count back from 20 0 points  Months in reverse 0 points  Repeat phrase 0 points  Total Score 0    Screening Tests Health Maintenance  Topic Date Due  . Hepatitis C Screening  [birth year]  . HIV Screening  10/15/1972  . TETANUS/TDAP  10/15/1976  . MAMMOGRAM  04/19/2015  . INFLUENZA VACCINE  10/04/2016 (Originally 02/05/2016)  . PAP SMEAR  03/21/2017  . COLONOSCOPY  05/29/2025      Plan:    End of life planning; Advance aging; Advanced directives discussed. No HCPOA/Living Will.  Additional information provided to help her start the conversation with her family.  Copy of HCPOA/Living Will requested upon completion.  Time spent on this topic is 20 minutes.   Medicare Attestation I have personally reviewed: The patient's medical and social history Their use of alcohol, tobacco or illicit drugs Their current medications and supplements The patient's functional ability including ADLs,fall risks, home safety risks, cognitive, and hearing and visual impairment Diet and physical activities Evidence for depression   The patient's weight, height, BMI, and visual acuity have been recorded in the chart.  I have made referrals and provided education to the patient based on review of the above and I have provided the patient with a  written personalized care plan for preventive services.    During the course of the visit, Meriam SpragueBeverly was educated and counseled about the following appropriate screening and preventive services:   Vaccines to include Pneumoccal, Influenza, Hepatitis B, Td, Zostavax, HCV  Electrocardiogram  Cardiovascular disease screening  Colorectal cancer screening  Bone density screening  Diabetes screening  Glaucoma screening  Mammography/PAP  Nutrition counseling  Smoking cessation counseling  Patient Instructions (the written plan) were given to the patient.    Ashok PallOBrien-Blaney, Maxi Rodas L, LPN   3/2/44012/12/2016

## 2016-08-28 ENCOUNTER — Encounter: Payer: Self-pay | Admitting: Family Medicine

## 2016-08-29 ENCOUNTER — Encounter: Payer: Self-pay | Admitting: Family Medicine

## 2016-09-12 ENCOUNTER — Other Ambulatory Visit: Payer: Self-pay | Admitting: Family Medicine

## 2016-09-12 NOTE — Telephone Encounter (Signed)
Refilled: 07/21/16 Last OV: 04/17/16 Last Labs: 04/17/16 Future OV: none Please advise?

## 2016-09-24 ENCOUNTER — Ambulatory Visit
Admission: RE | Admit: 2016-09-24 | Discharge: 2016-09-24 | Disposition: A | Discharge status: Home / Self Care | Payer: Commercial Managed Care - HMO | Source: Ambulatory Visit | Attending: Family Medicine | Admitting: Family Medicine

## 2016-09-24 DIAGNOSIS — Z1239 Encounter for other screening for malignant neoplasm of breast: Secondary | ICD-10-CM

## 2016-09-24 DIAGNOSIS — Z1231 Encounter for screening mammogram for malignant neoplasm of breast: Secondary | ICD-10-CM | POA: Insufficient documentation

## 2017-01-13 ENCOUNTER — Other Ambulatory Visit: Payer: Self-pay | Admitting: Family Medicine

## 2017-01-13 NOTE — Telephone Encounter (Signed)
Pt called and is requesting a refill on her naproxen (NAPROSYN) 500 MG tablet. Please advise, thank you!  Pharmacy - [facility] - [place], [place] - [id number]

## 2017-01-14 NOTE — Telephone Encounter (Signed)
Patient states Dr Wynetta Emeryram  Prescribed for spinal stenosis . She takes one a day.

## 2017-01-14 NOTE — Telephone Encounter (Signed)
Why is she taking chronically?

## 2017-01-14 NOTE — Telephone Encounter (Signed)
Last office visit Melissa Patton 08/12/16 Last office visit 04/17/16  Next office scheduled with Melissa Patton 08/13/17  Last prescribed by Melissa BostonJennifer Walker,MD

## 2017-01-14 NOTE — Telephone Encounter (Signed)
Tried calling patient signal was bad patient to call me back.

## 2017-01-15 MED ORDER — NAPROXEN 500 MG PO TABS: 500.0000 mg | ORAL_TABLET | Freq: Every day | ORAL | 0 refills | Status: DC | PRN

## 2017-01-15 NOTE — Telephone Encounter (Signed)
Patient advised.

## 2017-01-19 ENCOUNTER — Telehealth: Payer: Self-pay | Admitting: Family Medicine

## 2017-01-19 NOTE — Telephone Encounter (Signed)
Pt called requesting a refill on her temazepam (RESTORIL) 15 MG capsule. Please advise, thank you!@  Pharmacy - [facility] [phone] - [place], [place] - [address]  Call pt @ [phone]

## 2017-01-19 NOTE — Telephone Encounter (Signed)
No OV since 10/17 please advise to refill Restoril.

## 2017-01-20 ENCOUNTER — Other Ambulatory Visit: Payer: Self-pay | Admitting: Family Medicine

## 2017-01-20 MED ORDER — TEMAZEPAM 15 MG PO CAPS: 15.0000 mg | ORAL_CAPSULE | Freq: Every evening | ORAL | 1 refills | Status: DC | PRN

## 2017-01-20 NOTE — Telephone Encounter (Signed)
Script faxed.

## 2017-01-20 NOTE — Telephone Encounter (Signed)
Rx placed in bin.

## 2017-01-21 NOTE — Telephone Encounter (Signed)
[facility]. Script faxed to [facility] .  Patient notified script faxed to [facility].

## 2017-01-21 NOTE — Telephone Encounter (Signed)
Pt stated that this script should be sent to [facility] for a 90 day supply

## 2017-03-16 ENCOUNTER — Encounter: Payer: Self-pay | Admitting: Family Medicine

## 2017-03-16 ENCOUNTER — Ambulatory Visit (INDEPENDENT_AMBULATORY_CARE_PROVIDER_SITE_OTHER): Payer: Commercial Managed Care - HMO | Admitting: Family Medicine

## 2017-03-16 VITALS — BP 110/74 | HR 83 | Temp 98.3°F | Resp 12 | Wt 169.5 lb

## 2017-03-16 DIAGNOSIS — I10 Essential (primary) hypertension: Secondary | ICD-10-CM

## 2017-03-16 DIAGNOSIS — E785 Hyperlipidemia, unspecified: Secondary | ICD-10-CM | POA: Diagnosis not present

## 2017-03-16 DIAGNOSIS — Z23 Encounter for immunization: Secondary | ICD-10-CM

## 2017-03-16 DIAGNOSIS — R739 Hyperglycemia, unspecified: Secondary | ICD-10-CM

## 2017-03-16 DIAGNOSIS — Z130 Encounter for screening for diseases of the blood and blood-forming organs and certain disorders involving the immune mechanism: Secondary | ICD-10-CM | POA: Diagnosis not present

## 2017-03-16 LAB — COMPREHENSIVE METABOLIC PANEL
ALBUMIN: 4.5 g/dL (ref 3.5–5.2)
ALK PHOS: 54 U/L (ref 39–117)
ALT: 23 U/L (ref 0–35)
AST: 22 U/L (ref 0–37)
BILIRUBIN TOTAL: 0.6 mg/dL (ref 0.2–1.2)
BUN: 15 mg/dL (ref 6–23)
CALCIUM: 9.9 mg/dL (ref 8.4–10.5)
CHLORIDE: 102 meq/L (ref 96–112)
CO2: 28 mEq/L (ref 19–32)
CREATININE: 0.81 mg/dL (ref 0.40–1.20)
GFR: 76.81 mL/min (ref >60.00)
Glucose, Bld: 88 mg/dL (ref 70–99)
Potassium: 4 mEq/L (ref 3.5–5.1)
SODIUM: 139 meq/L (ref 135–145)
TOTAL PROTEIN: 7.1 g/dL (ref 6.0–8.3)

## 2017-03-16 LAB — LIPID PANEL
CHOL/HDL RATIO: 4
Cholesterol: 184 mg/dL (ref 0–200)
HDL: 51.9 mg/dL (ref >39.00)
LDL Cholesterol: 107 mg/dL — ABNORMAL HIGH (ref 0–99)
NONHDL: 132.29
Triglycerides: 127 mg/dL (ref 0.0–149.0)
VLDL: 25.4 mg/dL (ref 0.0–40.0)

## 2017-03-16 LAB — CBC
HCT: 42.5 % (ref 36.0–46.0)
Hemoglobin: 13.9 g/dL (ref 12.0–15.0)
MCHC: 32.7 g/dL (ref 30.0–36.0)
MCV: 87.6 fl (ref 78.0–100.0)
PLATELETS: 335 10*3/uL (ref 150.0–400.0)
RBC: 4.86 Mil/uL (ref 3.87–5.11)
RDW: 13.7 % (ref 11.5–15.5)
WBC: 6.3 10*3/uL (ref 4.0–10.5)

## 2017-03-16 LAB — HEMOGLOBIN A1C: Hgb A1c MFr Bld: 6.6 % — ABNORMAL HIGH (ref 4.6–6.5)

## 2017-03-16 NOTE — Patient Instructions (Addendum)
Continue your medication.  Follow-up in 6 months  Take care  Dr. Sherl Yzaguirre  

## 2017-03-16 NOTE — Assessment & Plan Note (Signed)
At goal. Continue HCTZ. Labs today.

## 2017-03-16 NOTE — Assessment & Plan Note (Signed)
Lipid panel today. May need dose adjustment of lipitor pending labs. Continue current dosing of lipitor.

## 2017-03-16 NOTE — Progress Notes (Signed)
Subjective:  Patient ID: Casimer Leek, female    DOB: [birth year]  Age: 61 y.o. MRN: [id number]  CC: Follow up  HPI:  61 year old female with HTN, HLD, chronic back pain presents for follow up. She has no complaints today.  HTN  At goal on HCTZ.  Hyperlipidemia  Has been uncontrolled.  Needs lipid panel today.  Is currently on Lipitor 40 mg daily.  Social Hx   Social History   Social History  . Marital status: Married    Spouse name: N/A  . Number of children: N/A  . Years of education: N/A   Social History Main Topics  . Smoking status: Former Games developer  . Smokeless tobacco: Never Used     Comment: quit smoking many yrs ago  . Alcohol use No  . Drug use: No  . Sexual activity: Not Currently    Birth control/ protection: Surgical   Other Topics Concern  . None   Social History Narrative   Lives in [place] with husband. Dog in home. 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   Respiratory: Negative.   Cardiovascular: Negative.    Objective:  BP 110/74 (BP Location: Left Arm, Patient Position: Sitting, Cuff Size: Normal)   Pulse 83   Temp 98.3 F (36.8 C) (Oral)   Resp 12   Wt 169 lb 8 oz (76.9 kg)   SpO2 97%   BMI 28.21 kg/m   BP/Weight 03/16/2017 08/12/2016 04/17/2016  Systolic BP 110 110 118  Diastolic BP 74 68 72  Wt. (Lbs) 169.5 175 174.75  BMI 28.21 29.12 28.21    Physical Exam  Constitutional: She is oriented to person, place, and time. She appears well-developed. No distress.  HENT:  Head: Normocephalic and atraumatic.  Cardiovascular: Normal rate and regular rhythm.   No murmur heard. Pulmonary/Chest: Effort normal. She has no wheezes. She has no rales.  Neurological: She is alert and oriented to person, place, and time.  Psychiatric: She has a normal mood and affect.  Vitals reviewed.  Lab Results  Component Value Date     WBC 6.6 04/06/2015   HGB 13.6 04/06/2015   HCT 41.6 04/06/2015   PLT 304.0 04/06/2015   GLUCOSE 134 (H) 10/11/2015   CHOL 226 (H) 04/17/2016   TRIG 163.0 (H) 04/17/2016   HDL 50.40 04/17/2016   LDLDIRECT 230.5 04/14/2012   LDLCALC 143 (H) 04/17/2016   ALT 23 10/11/2015   AST 22 10/11/2015   NA 135 10/11/2015   K 4.1 10/11/2015   CL 98 10/11/2015   CREATININE 0.94 10/11/2015   BUN 19 10/11/2015   CO2 31 10/11/2015   TSH 1.73 04/06/2015   MICROALBUR 0.2 03/21/2014    Assessment & Plan:   Problem List Items Addressed This Visit    Hyperlipidemia    Lipid panel today. May need dose adjustment of lipitor pending labs. Continue current dosing of lipitor.       Relevant Orders   Lipid panel   HTN (hypertension) - Primary    At goal. Continue HCTZ. Labs today.      Relevant Orders   Comprehensive metabolic panel    Other Visit Diagnoses    Screening for deficiency anemia       Relevant Orders   CBC   Elevated blood sugar  Relevant Orders   Hemoglobin A1c   Need for immunization against influenza       Relevant Orders   Flu Vaccine QUAD 36+ mos IM (Completed)     Follow-up: 6 months.  Everlene OtherJayce Araceli Coufal DO [facility] [place]

## 2017-03-18 ENCOUNTER — Encounter: Payer: Self-pay | Admitting: Family Medicine

## 2017-03-18 DIAGNOSIS — E119 Type 2 diabetes mellitus without complications: Secondary | ICD-10-CM | POA: Insufficient documentation

## 2017-03-25 ENCOUNTER — Other Ambulatory Visit: Payer: Self-pay | Admitting: Family Medicine

## 2017-03-30 ENCOUNTER — Other Ambulatory Visit: Payer: Self-pay | Admitting: Family Medicine

## 2017-03-30 MED ORDER — METFORMIN HCL 500 MG PO TABS: 500.0000 mg | ORAL_TABLET | Freq: Two times a day (BID) | ORAL | 3 refills | Status: DC

## 2017-04-14 ENCOUNTER — Telehealth: Payer: Self-pay | Admitting: *Deleted

## 2017-04-14 NOTE — Telephone Encounter (Signed)
Pt requested to have a script for a blood sugar meter with supplies Pt will establish with new provider in Nov  Pharmacy [facility] mail order

## 2017-04-15 MED ORDER — BLOOD GLUCOSE MONITOR KIT: PACK | 0 refills | Status: AC

## 2017-04-15 NOTE — Telephone Encounter (Signed)
Spoke with patient new PCP will be Dr De Nurse , would like script sent to [facility], [place]

## 2017-04-15 NOTE — Telephone Encounter (Signed)
Please fax

## 2017-04-16 NOTE — Telephone Encounter (Signed)
Script faxed to [facility] by Shanda Bumps

## 2017-06-12 DIAGNOSIS — I10 Essential (primary) hypertension: Secondary | ICD-10-CM | POA: Diagnosis not present

## 2017-06-12 DIAGNOSIS — E785 Hyperlipidemia, unspecified: Secondary | ICD-10-CM | POA: Diagnosis not present

## 2017-06-12 DIAGNOSIS — Z6827 Body mass index (BMI) 27.0-27.9, adult: Secondary | ICD-10-CM | POA: Diagnosis not present

## 2017-06-12 DIAGNOSIS — R7303 Prediabetes: Secondary | ICD-10-CM | POA: Diagnosis not present

## 2017-06-12 DIAGNOSIS — Z1389 Encounter for screening for other disorder: Secondary | ICD-10-CM | POA: Diagnosis not present

## 2017-08-13 ENCOUNTER — Ambulatory Visit: Payer: Commercial Managed Care - HMO

## 2017-08-24 ENCOUNTER — Telehealth: Payer: Self-pay | Admitting: Family Medicine

## 2017-08-24 NOTE — Telephone Encounter (Signed)
Noted.  That is fine.  She was a prior [place] patient and I have not seen her previously.  Thanks.

## 2017-08-24 NOTE — Telephone Encounter (Unsigned)
Copied from CRM [phone]. Topic: Quick Communication - See Telephone Encounter >> Aug 24, 2017  1:22 PM Waymon AmatoBurton, Donna F wrote: CRM for notification. See Telephone encounter for:  Pt is calling to let the office know to take her off the call list she no longer is wanting to be seen at this practive of [place] station 08/24/17.

## 2017-11-16 ENCOUNTER — Other Ambulatory Visit: Payer: Self-pay | Admitting: Nurse Practitioner

## 2017-11-16 DIAGNOSIS — Z23 Encounter for immunization: Secondary | ICD-10-CM | POA: Diagnosis not present

## 2017-11-16 DIAGNOSIS — E119 Type 2 diabetes mellitus without complications: Secondary | ICD-10-CM | POA: Diagnosis not present

## 2017-11-16 DIAGNOSIS — Z1159 Encounter for screening for other viral diseases: Secondary | ICD-10-CM | POA: Diagnosis not present

## 2017-11-16 DIAGNOSIS — Z0000 Encounter for general adult medical examination without abnormal findings: Secondary | ICD-10-CM | POA: Diagnosis not present

## 2017-11-16 DIAGNOSIS — M542 Cervicalgia: Secondary | ICD-10-CM | POA: Diagnosis not present

## 2017-11-16 DIAGNOSIS — Z1231 Encounter for screening mammogram for malignant neoplasm of breast: Secondary | ICD-10-CM

## 2017-11-16 DIAGNOSIS — R7303 Prediabetes: Secondary | ICD-10-CM | POA: Diagnosis not present

## 2017-11-16 DIAGNOSIS — Z1239 Encounter for other screening for malignant neoplasm of breast: Secondary | ICD-10-CM | POA: Diagnosis not present

## 2017-11-16 DIAGNOSIS — I10 Essential (primary) hypertension: Secondary | ICD-10-CM | POA: Diagnosis not present

## 2017-11-16 DIAGNOSIS — Z6827 Body mass index (BMI) 27.0-27.9, adult: Secondary | ICD-10-CM | POA: Diagnosis not present

## 2017-11-16 DIAGNOSIS — Z1389 Encounter for screening for other disorder: Secondary | ICD-10-CM | POA: Diagnosis not present

## 2017-12-07 ENCOUNTER — Ambulatory Visit
Admission: RE | Admit: 2017-12-07 | Discharge: 2017-12-07 | Disposition: A | Discharge status: Home / Self Care | Payer: Medicare HMO | Source: Ambulatory Visit | Attending: Nurse Practitioner | Admitting: Nurse Practitioner

## 2017-12-07 DIAGNOSIS — Z1231 Encounter for screening mammogram for malignant neoplasm of breast: Secondary | ICD-10-CM | POA: Insufficient documentation

## 2018-01-12 ENCOUNTER — Telehealth: Payer: Self-pay | Admitting: *Deleted

## 2018-01-12 NOTE — Telephone Encounter (Signed)
Unsure of who called patient, no documentation

## 2018-01-12 NOTE — Telephone Encounter (Signed)
Copied from CRM [phone]. Topic: Quick Communication - Office Called Patient >> Jan 12, 2018 11:52 AM Stephannie LiSimmons, Janett L, NT wrote: Reason for CRM: Patient returned missed call from practice and would like a call back ,

## 2018-02-09 DIAGNOSIS — E785 Hyperlipidemia, unspecified: Secondary | ICD-10-CM | POA: Diagnosis not present

## 2018-02-09 DIAGNOSIS — I10 Essential (primary) hypertension: Secondary | ICD-10-CM | POA: Diagnosis not present

## 2018-02-09 DIAGNOSIS — E119 Type 2 diabetes mellitus without complications: Secondary | ICD-10-CM | POA: Diagnosis not present

## 2018-03-30 DIAGNOSIS — Z6827 Body mass index (BMI) 27.0-27.9, adult: Secondary | ICD-10-CM | POA: Diagnosis not present

## 2018-03-30 DIAGNOSIS — I10 Essential (primary) hypertension: Secondary | ICD-10-CM | POA: Diagnosis not present

## 2018-03-30 DIAGNOSIS — Z23 Encounter for immunization: Secondary | ICD-10-CM | POA: Diagnosis not present

## 2018-03-30 DIAGNOSIS — R7303 Prediabetes: Secondary | ICD-10-CM | POA: Diagnosis not present

## 2018-03-30 DIAGNOSIS — E785 Hyperlipidemia, unspecified: Secondary | ICD-10-CM | POA: Diagnosis not present

## 2018-03-30 DIAGNOSIS — E119 Type 2 diabetes mellitus without complications: Secondary | ICD-10-CM | POA: Diagnosis not present

## 2018-03-30 DIAGNOSIS — R0982 Postnasal drip: Secondary | ICD-10-CM | POA: Diagnosis not present

## 2018-03-31 DIAGNOSIS — E119 Type 2 diabetes mellitus without complications: Secondary | ICD-10-CM | POA: Diagnosis not present

## 2018-03-31 DIAGNOSIS — I10 Essential (primary) hypertension: Secondary | ICD-10-CM | POA: Diagnosis not present

## 2018-05-14 ENCOUNTER — Other Ambulatory Visit: Payer: Self-pay

## 2018-05-14 ENCOUNTER — Encounter: Payer: Self-pay | Admitting: Emergency Medicine

## 2018-05-14 ENCOUNTER — Emergency Department
Admission: EM | Admit: 2018-05-14 | Discharge: 2018-05-14 | Disposition: A | Discharge status: Home / Self Care | Payer: Medicare HMO | Attending: Emergency Medicine | Admitting: Emergency Medicine

## 2018-05-14 ENCOUNTER — Emergency Department: Payer: Medicare HMO

## 2018-05-14 DIAGNOSIS — Z79899 Other long term (current) drug therapy: Secondary | ICD-10-CM | POA: Insufficient documentation

## 2018-05-14 DIAGNOSIS — I10 Essential (primary) hypertension: Secondary | ICD-10-CM | POA: Insufficient documentation

## 2018-05-14 DIAGNOSIS — Z87891 Personal history of nicotine dependence: Secondary | ICD-10-CM | POA: Diagnosis not present

## 2018-05-14 DIAGNOSIS — R519 Headache, unspecified: Secondary | ICD-10-CM

## 2018-05-14 DIAGNOSIS — R51 Headache: Secondary | ICD-10-CM | POA: Insufficient documentation

## 2018-05-14 DIAGNOSIS — Z7984 Long term (current) use of oral hypoglycemic drugs: Secondary | ICD-10-CM | POA: Diagnosis not present

## 2018-05-14 DIAGNOSIS — E119 Type 2 diabetes mellitus without complications: Secondary | ICD-10-CM | POA: Diagnosis not present

## 2018-05-14 DIAGNOSIS — R6884 Jaw pain: Secondary | ICD-10-CM | POA: Diagnosis not present

## 2018-05-14 HISTORY — DX: Essential (primary) hypertension: I10

## 2018-05-14 LAB — BASIC METABOLIC PANEL
Anion gap: 11 (ref 5–15)
BUN: 14 mg/dL (ref 6–20)
CO2: 26 mmol/L (ref 22–32)
Calcium: 9.9 mg/dL (ref 8.9–10.3)
Chloride: 102 mmol/L (ref 98–111)
Creatinine, Ser: 1.27 mg/dL — ABNORMAL HIGH (ref 0.44–1.00)
GFR calc Af Amer: 52 mL/min — ABNORMAL LOW (ref >60)
GFR calc non Af Amer: 45 mL/min — ABNORMAL LOW (ref >60)
Glucose, Bld: 119 mg/dL — ABNORMAL HIGH (ref 70–99)
Potassium: 3.5 mmol/L (ref 3.5–5.1)
Sodium: 139 mmol/L (ref 135–145)

## 2018-05-14 LAB — CBC WITH DIFFERENTIAL/PLATELET
Abs Immature Granulocytes: 0.02 10*3/uL (ref 0.00–0.07)
Basophils Absolute: 0.1 10*3/uL (ref 0.0–0.1)
Basophils Relative: 1 %
Eosinophils Absolute: 0.3 10*3/uL (ref 0.0–0.5)
Eosinophils Relative: 5 %
HCT: 41.6 % (ref 36.0–46.0)
Hemoglobin: 13.6 g/dL (ref 12.0–15.0)
Immature Granulocytes: 0 %
Lymphocytes Relative: 24 %
Lymphs Abs: 1.5 10*3/uL (ref 0.7–4.0)
MCH: 28.6 pg (ref 26.0–34.0)
MCHC: 32.7 g/dL (ref 30.0–36.0)
MCV: 87.4 fL (ref 80.0–100.0)
Monocytes Absolute: 1 10*3/uL (ref 0.1–1.0)
Monocytes Relative: 16 %
Neutro Abs: 3.4 10*3/uL (ref 1.7–7.7)
Neutrophils Relative %: 54 %
Platelets: 320 10*3/uL (ref 150–400)
RBC: 4.76 MIL/uL (ref 3.87–5.11)
RDW: 12.9 % (ref 11.5–15.5)
WBC: 6.2 10*3/uL (ref 4.0–10.5)
nRBC: 0 % (ref 0.0–0.2)

## 2018-05-14 LAB — GROUP A STREP BY PCR: Group A Strep by PCR: NOT DETECTED

## 2018-05-14 LAB — TROPONIN I: Troponin I: <0.03 ng/mL (ref <0.03)

## 2018-05-14 MED ORDER — ACETAMINOPHEN 500 MG PO TABS
1000.0000 mg | ORAL_TABLET | Freq: Once | ORAL | Status: AC
  Administered 2018-05-14: 1000 mg via ORAL
  Filled 2018-05-14: qty 2

## 2018-05-14 MED ORDER — PREDNISONE 50 MG PO TABS: ORAL_TABLET | ORAL | 0 refills | Status: DC

## 2018-05-14 MED ORDER — IOHEXOL 300 MG/ML  SOLN
75.0000 mL | Freq: Once | INTRAMUSCULAR | Status: AC | PRN
  Administered 2018-05-14: 75 mL via INTRAVENOUS
  Filled 2018-05-14: qty 75

## 2018-05-14 NOTE — ED Provider Notes (Signed)
[facility] Emergency Department Provider Note  ____________________________________________  Time seen: Approximately 10:03 PM  I have reviewed the triage vital signs and the nursing notes.   HISTORY  Chief Complaint Facial Pain    HPI Melissa Patton is a 62 y.o. female presents to the emergency department with 10 out of 10 right lower jaw pain that radiates along right jaw to temple and along right side of neck.  Patient reports that symptoms have occurred over the past 24 hours.  She has never experienced similar symptoms in the past.  Patient has had all of her teeth pulled and wears dentures.  Patient reports that pain is so exquisite that she is unable to wear her dentures on the bottom.  She denies falls or mechanisms of trauma.  She has had some chills at home but has not experienced fever.  She has had some congestion over the past week but reports that her symptoms feel different than any sinus infection that she has had in the past.  Patient denies changes in vision, nausea or vomiting.  She denies a history of temporal arteritis. She denies ear pain.    Past Medical History:  Diagnosis Date  . Arthritis   . Chicken pox   . Chronic neck pain    DDD/spondylosis/stenosis  . Dry eyes    dry eyes d/t use of contacts  . Hyperlipidemia    takes Atorvastatin daily  . Hypertension   . IBS (irritable bowel syndrome)   . Insomnia   . Nocturia   . Urinary frequency   . Urinary incontinence   . Urinary urgency     Patient Active Problem List   Diagnosis Date Noted  . DM2 (diabetes mellitus, type 2) (HCC) 03/18/2017  . Insomnia 04/17/2016  . Allergy 04/17/2016  . Esophageal reflux 04/06/2015  . HTN (hypertension) 05/10/2013  . Chronic low back pain 04/14/2012  . Urinary incontinence 04/14/2012  . Hyperlipidemia 04/14/2012    Past Surgical History:  Procedure Laterality Date  . ABDOMINAL HYSTERECTOMY     30 years ago  . ANTERIOR CERVICAL  DECOMP/DISCECTOMY FUSION  05/17/2012   Procedure: ANTERIOR CERVICAL DECOMPRESSION/DISCECTOMY FUSION 3 LEVELS;  Surgeon: Gary P Cram, MD;  Location: [facility];  Service: Neurosurgery;  Laterality: Bilateral;  Cervical three-four, four-five, five-six anterior cervical discectomy with fusion  . back pain     hx of buldging disc  . BACK SURGERY     x 2  . CARPAL TUNNEL RELEASE    . COLONOSCOPY  17 years ago  . COLONOSCOPY WITH PROPOFOL N/A 05/22/2015   Procedure: COLONOSCOPY WITH PROPOFOL;  Surgeon: Jeffrey W Byrnett, MD;  Location: [facility] ENDOSCOPY;  Service: Endoscopy;  Laterality: N/A;  . ESOPHAGOGASTRODUODENOSCOPY    . FOOT SURGERY     d/t hammer toe-bil  . hammer toe    . LUMBAR LAMINECTOMY  1989, 1991   [place], Dr. Deaton  . SHOULDER ARTHROSCOPY Right 01/2013   Dr. Kevin Supple - [facility]  . TUBAL LIGATION    . VAGINAL DELIVERY      Prior to Admission medications   Medication Sig Start Date End Date Taking? Authorizing Provider  Aloe LIQD Take by mouth as needed.    [provider]  atorvastatin (LIPITOR) 40 MG tablet TAKE 1 TABLET EVERY DAY 03/26/17   Scott, Charlene, MD  blood glucose meter kit and supplies KIT Dispense based on patient and insurance preference. Check blood sugar once daily fasting. ICD 10 code   E11.9. 04/15/17   Sonnenberg, Eric G, MD  cyclobenzaprine (FLEXERIL) 10 MG tablet TAKE 1 TABLET THREE TIMES DAILY AS NEEDED FOR MUSCLE SPASM(S) 09/12/16   Cook, Jayce G, DO  hydrochlorothiazide (HYDRODIURIL) 25 MG tablet TAKE 1 TABLET (25 MG TOTAL) BY MOUTH DAILY. 03/26/17   Scott, Charlene, MD  lidocaine (LIDODERM) 5 % APPLY 1 PATCH ON SKIN DAILY, REMOVE AND DISCARD PATCH WITHIN 12 HOURS (12 HRS ON, 12 HRS OFF) 04/17/16   Cook, Jayce G, DO  metFORMIN (GLUCOPHAGE) 500 MG tablet Take 1 tablet (500 mg total) by mouth 2 (two) times daily with a meal. 03/30/17   Cook, Jayce G, DO  naproxen (NAPROSYN) 500 MG tablet Take 1 tablet (500 mg total) by mouth daily as  needed. 01/15/17   Cook, Jayce G, DO  polyethylene glycol powder (GLYCOLAX/MIRALAX) powder 255 grams one bottle for colonoscopy prep 05/07/15   Byrnett, Jeffrey W, MD  predniSONE (DELTASONE) 50 MG tablet Take one 50 mg tablet once daily for the next five days. 05/14/18   Woods, Jaclyn M, PA-C  temazepam (RESTORIL) 15 MG capsule Take 1 capsule (15 mg total) by mouth at bedtime as needed. 01/20/17   Cook, Jayce G, DO    Allergies Hydrocodone; Tramadol; and Oxycodone  Family History  Problem Relation Age of Onset  . Heart disease Mother   . Diabetes Mother   . Heart disease Father   . Diabetes Father   . Diabetes Brother   . Depression Brother   . Breast cancer Neg Hx     Social History Social History   Tobacco Use  . Smoking status: Former Smoker  . Smokeless tobacco: Never Used  . Tobacco comment: quit smoking many yrs ago  Substance Use Topics  . Alcohol use: No    Alcohol/week: 0.0 standard drinks  . Drug use: No     Review of Systems  Constitutional: No fever/chills Eyes: No visual changes. No discharge ENT: Patient has right lower jaw pain.  Cardiovascular: no chest pain. Respiratory: no cough. No SOB. Gastrointestinal: No abdominal pain.  No nausea, no vomiting.  No diarrhea.  No constipation. Genitourinary: Negative for dysuria. No hematuria Musculoskeletal: Negative for musculoskeletal pain. Skin: Negative for rash, abrasions, lacerations, ecchymosis. Neurological: Negative for headaches, focal weakness or numbness.   ____________________________________________   PHYSICAL EXAM:  VITAL SIGNS: ED Triage Vitals  Enc Vitals Group     BP 05/14/18 2001 (!) 148/84     Pulse Rate 05/14/18 2001 (!) 105     Resp 05/14/18 2001 18     Temp 05/14/18 2001 98.8 F (37.1 C)     Temp Source 05/14/18 2001 Oral     SpO2 05/14/18 2001 98 %     Weight 05/14/18 2001 140 lb (63.5 kg)     Height 05/14/18 2001 5' 6" (1.676 m)     Head Circumference --      Peak Flow --       Pain Score 05/14/18 2019 7     Pain Loc --      Pain Edu? --      Excl. in GC? --      Constitutional: Alert and oriented. Well appearing and in no acute distress. Eyes: Conjunctivae are normal. PERRL. EOMI. Head: Atraumatic. ENT:      Ears: TMs are pearly.      Nose: No congestion/rhinnorhea.      Mouth/Throat: Mucous membranes are moist.  Patient's dentition has been removed and upper dentures are present but   not lower.  Patient has tenderness with palpation over the right lower jaw but no significant swelling. Neck: No stridor.  No cervical spine tenderness to palpation. Hematological/Lymphatic/Immunilogical: No cervical lymphadenopathy. Cardiovascular: Normal rate, regular rhythm. Normal S1 and S2.  Good peripheral circulation. Respiratory: Normal respiratory effort without tachypnea or retractions. Lungs CTAB. Good air entry to the bases with no decreased or absent breath sounds. Skin:  Skin is warm, dry and intact. No rash noted.   ____________________________________________   LABS (all labs ordered are listed, but only abnormal results are displayed)  Labs Reviewed  BASIC METABOLIC PANEL - Abnormal; Notable for the following components:      Result Value   Glucose, Bld 119 (*)    Creatinine, Ser 1.27 (*)    GFR calc non Af Amer 45 (*)    GFR calc Af Amer 52 (*)    All other components within normal limits  GROUP A STREP BY PCR  CBC WITH DIFFERENTIAL/PLATELET  TROPONIN I   ____________________________________________  EKG  Normal sinus rhythm without ST segment elevation.  No T wave abnormalities. ____________________________________________  RADIOLOGY I personally viewed and evaluated these images as part of my medical decision making, as well as reviewing the written report by the radiologist.     Ct Soft Tissue Neck W Contrast  Result Date: 05/14/2018 CLINICAL DATA:  Initial evaluation for acute right jaw pain radiating to right ear, sore throat. EXAM:  CT NECK WITH CONTRAST TECHNIQUE: Multidetector CT imaging of the neck was performed using the standard protocol following the bolus administration of intravenous contrast. CONTRAST:  75mL OMNIPAQUE IOHEXOL 300 MG/ML  SOLN COMPARISON:  None available. FINDINGS: Pharynx and larynx: Oral cavity within normal limits without mass lesion or loculated fluid collection. Patient is edentulous. No abnormality at the base of tongue, although evaluation mildly limited by motion artifact. Palatine tonsils symmetric in size without acute abnormality. No tonsillar or peritonsillar abscess. Parapharyngeal fat maintained. Several calcified tonsilliths noted bilaterally. Nasopharynx within normal limits. No retropharyngeal collection. Epiglottis normal. Vallecula partially effaced by the lingual tonsils but within normal limits. Remainder of the hypopharynx and supraglottic larynx within normal limits. Slight asymmetry within the true cords with slight ballooning of the left true cord could reflect a mild vocal cord palsy, not entirely certain, but felt to be incidental in nature and of doubtful clinical significance in the acute setting. Glottis otherwise unremarkable. Subglottic airway widely patent and clear. Salivary glands: Salivary glands including the parotid glands and submandibular glands are within normal limits. Thyroid: Subcentimeter hypodense right thyroid nodule noted, of doubtful significance. Thyroid otherwise unremarkable. Lymph nodes: Mildly prominent left level IIa node measures at the upper limits of normal at 1 cm in short axis. No pathologically enlarged lymph nodes identified within the neck. Vascular: Normal intravascular enhancement seen throughout the neck. Mild scattered atheromatous change about the aortic arch and carotid bifurcations. Limited intracranial: Unremarkable. Visualized orbits: Unremarkable. Mastoids and visualized paranasal sinuses: Visualized paranasal sinuses are clear. Visualized mastoid  air cells and middle ear cavities are well pneumatized and free of fluid. Skeleton: No acute osseus abnormality. No discrete lytic or blastic osseous lesions. Prior ACDF at C3 through C6. Upper chest: Visualized upper chest demonstrates no acute finding. Partially visualized lungs are grossly clear. Other: None. IMPRESSION: 1. No CT evidence for acute abnormality within the neck. No acute inflammatory changes, adenopathy, or mass lesion. 2. Prior ACDF at C3 through C6. Electronically Signed   By: Benjamin  McClintock M.D.   On: 05/14/2018   22:37    ____________________________________________    PROCEDURES  Procedure(s) performed:    Procedures    Medications  acetaminophen (TYLENOL) tablet 1,000 mg (1,000 mg Oral Given 05/14/18 2121)  iohexol (OMNIPAQUE) 300 MG/ML solution 75 mL (75 mLs Intravenous Contrast Given 05/14/18 2213)     ____________________________________________   INITIAL IMPRESSION / ASSESSMENT AND PLAN / ED COURSE  Pertinent labs & imaging results that were available during my care of the patient were reviewed by me and considered in my medical decision making (see chart for details).  Review of the [place] CSRS was performed in accordance of the [facility] prior to dispensing any controlled drugs.      Assessment and Plan:  Right jaw pain Differential diagnosis included temporal arteritis, TMJ arthritis, trigeminal neuralgia osteomyelitis of the jaw, dental abscess, and ST segment elevation MI.  CT soft tissue neck revealed no findings consistent with osteomyelitis of the jaw or dental abscess.  EKG revealed no ST segment elevation. Troponin was within reference range.  Pain was not reproducible with TMJ testing.  Absence of rash or recent zoster  decreases suspicion for trigeminal neuralgia.  Patient's right lower jaw pain did radiate into the right temple.  Patient has had no visual changes.  Suspicion for temporal arteritis is low but was discharged home with prednisone.   Strict return precautions were given to return to the emergency department for new or worsening symptoms.  All patient questions were answered   ____________________________________________  FINAL CLINICAL IMPRESSION(S) / ED DIAGNOSES  Final diagnoses:  Facial pain      NEW MEDICATIONS STARTED DURING THIS VISIT:  ED Discharge Orders         Ordered    predniSONE (DELTASONE) 50 MG tablet     05/14/18 2248              This chart was dictated using voice recognition software/Dragon. Despite best efforts to proofread, errors can occur which can change the meaning. Any change was purely unintentional.    Woods, Jaclyn M, PA-C 05/14/18 2350    Schaevitz, David Matthew, MD 05/15/18 0020  

## 2018-05-14 NOTE — ED Triage Notes (Signed)
Pt reports developed right jaw pain that radiates to her right ear reports not able to wear her dentures due to pain, reports "I feel like a had a fever", pt talks in complete sentences no distress noted.

## 2018-06-02 DIAGNOSIS — I10 Essential (primary) hypertension: Secondary | ICD-10-CM | POA: Diagnosis not present

## 2018-06-02 DIAGNOSIS — E119 Type 2 diabetes mellitus without complications: Secondary | ICD-10-CM | POA: Diagnosis not present

## 2018-06-07 DIAGNOSIS — I10 Essential (primary) hypertension: Secondary | ICD-10-CM | POA: Diagnosis not present

## 2018-06-07 DIAGNOSIS — E785 Hyperlipidemia, unspecified: Secondary | ICD-10-CM | POA: Diagnosis not present

## 2018-07-13 DIAGNOSIS — R7303 Prediabetes: Secondary | ICD-10-CM | POA: Diagnosis not present

## 2018-07-13 DIAGNOSIS — I10 Essential (primary) hypertension: Secondary | ICD-10-CM | POA: Diagnosis not present

## 2018-07-13 DIAGNOSIS — Z6827 Body mass index (BMI) 27.0-27.9, adult: Secondary | ICD-10-CM | POA: Diagnosis not present

## 2018-07-13 DIAGNOSIS — E785 Hyperlipidemia, unspecified: Secondary | ICD-10-CM | POA: Diagnosis not present

## 2018-07-13 DIAGNOSIS — E119 Type 2 diabetes mellitus without complications: Secondary | ICD-10-CM | POA: Diagnosis not present

## 2018-07-27 DIAGNOSIS — E785 Hyperlipidemia, unspecified: Secondary | ICD-10-CM | POA: Diagnosis not present

## 2018-07-27 DIAGNOSIS — I10 Essential (primary) hypertension: Secondary | ICD-10-CM | POA: Diagnosis not present

## 2018-09-22 DIAGNOSIS — E785 Hyperlipidemia, unspecified: Secondary | ICD-10-CM | POA: Diagnosis not present

## 2018-09-22 DIAGNOSIS — E119 Type 2 diabetes mellitus without complications: Secondary | ICD-10-CM | POA: Diagnosis not present

## 2018-09-22 DIAGNOSIS — I10 Essential (primary) hypertension: Secondary | ICD-10-CM | POA: Diagnosis not present

## 2018-10-07 DIAGNOSIS — E119 Type 2 diabetes mellitus without complications: Secondary | ICD-10-CM | POA: Diagnosis not present

## 2018-10-07 DIAGNOSIS — I10 Essential (primary) hypertension: Secondary | ICD-10-CM | POA: Diagnosis not present

## 2018-10-07 DIAGNOSIS — E785 Hyperlipidemia, unspecified: Secondary | ICD-10-CM | POA: Diagnosis not present

## 2018-11-02 DIAGNOSIS — I10 Essential (primary) hypertension: Secondary | ICD-10-CM | POA: Diagnosis not present

## 2018-11-02 DIAGNOSIS — E119 Type 2 diabetes mellitus without complications: Secondary | ICD-10-CM | POA: Diagnosis not present

## 2018-11-02 DIAGNOSIS — E785 Hyperlipidemia, unspecified: Secondary | ICD-10-CM | POA: Diagnosis not present

## 2019-01-18 ENCOUNTER — Other Ambulatory Visit: Payer: Self-pay | Admitting: Nurse Practitioner

## 2019-02-01 ENCOUNTER — Other Ambulatory Visit: Payer: Self-pay | Admitting: Family Medicine

## 2019-02-01 DIAGNOSIS — Z1231 Encounter for screening mammogram for malignant neoplasm of breast: Secondary | ICD-10-CM

## 2019-02-03 ENCOUNTER — Encounter (INDEPENDENT_AMBULATORY_CARE_PROVIDER_SITE_OTHER): Payer: Self-pay

## 2019-02-03 ENCOUNTER — Ambulatory Visit
Admission: RE | Admit: 2019-02-03 | Discharge: 2019-02-03 | Disposition: A | Discharge status: Home / Self Care | Payer: Medicare HMO | Source: Ambulatory Visit | Attending: Family Medicine | Admitting: Family Medicine

## 2019-02-03 ENCOUNTER — Other Ambulatory Visit: Payer: Self-pay

## 2019-02-03 DIAGNOSIS — Z1231 Encounter for screening mammogram for malignant neoplasm of breast: Secondary | ICD-10-CM | POA: Diagnosis present

## 2019-10-09 IMAGING — CT CT NECK W/ CM
4 of 5 series · 15 of 33 positions shown, 17 images · IV contrast (omnipaque)
Comparison: None available.

CLINICAL DATA: Initial evaluation for acute right jaw pain
radiating to right ear, sore throat.

EXAM:
CT NECK WITH CONTRAST
TECHNIQUE: Multidetector CT imaging of the neck was performed using the
standard protocol following the bolus administration of intravenous
contrast.
CONTRAST:  75mL OMNIPAQUE IOHEXOL 300 MG/ML  SOLN

[Series 2: axial neck · axial · 0.55mm/px · z∈[+10,+130]mm · 4 of 98 slices shown, 5 images]
[im 20/98  soft-tissue]
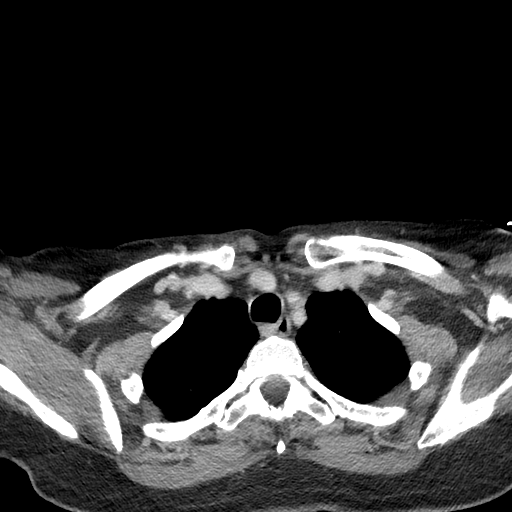
[im 20/98  bone]
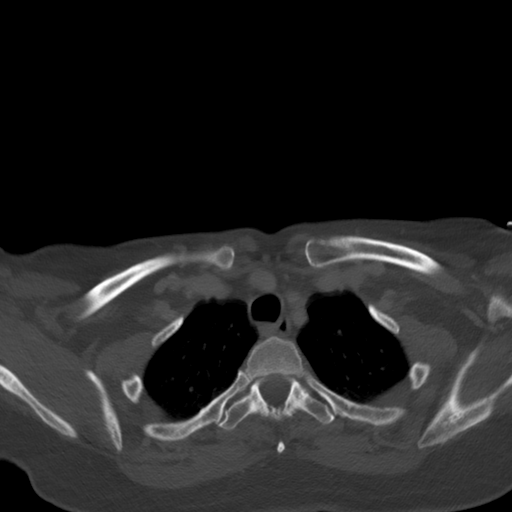
[im 39/98  bone]
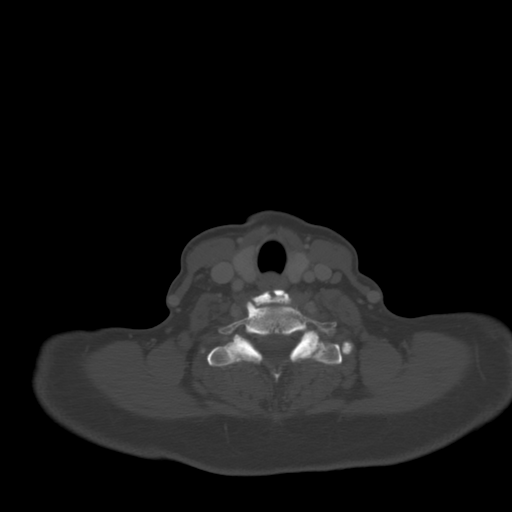
[im 59/98  bone]
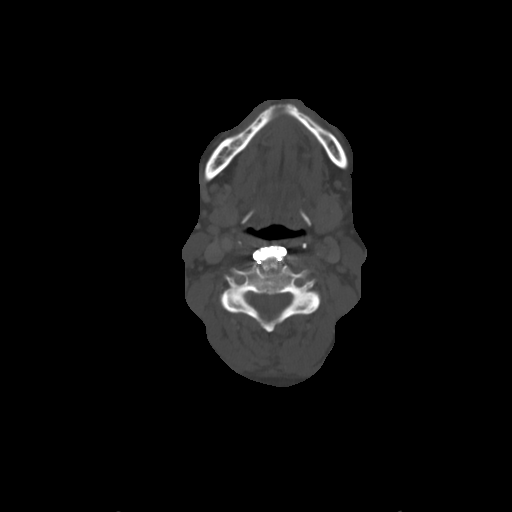
[im 78/98  bone]
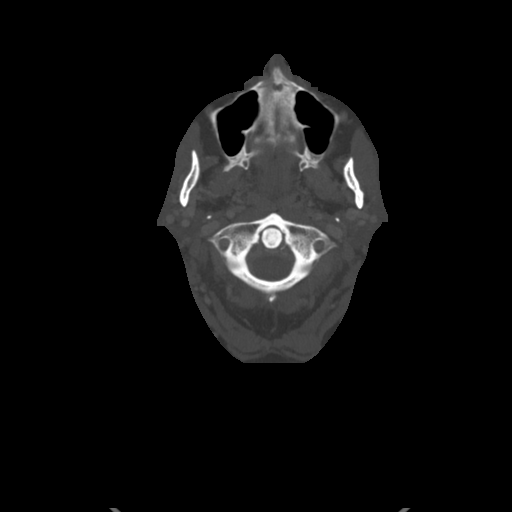

[Series 6: sag neck · sagittal · 0.41mm/px · 5 of 75 slices shown, 6 images]
[im 25/75  bone]
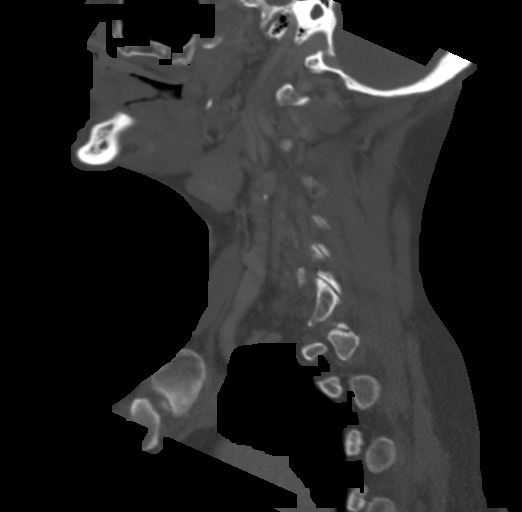
[im 31/75  bone]
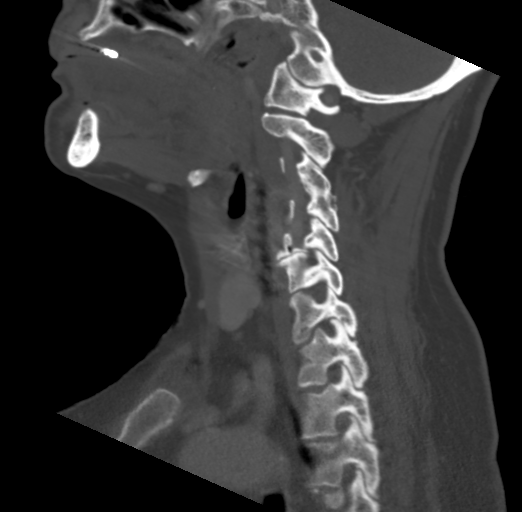
[im 38/75  soft-tissue]
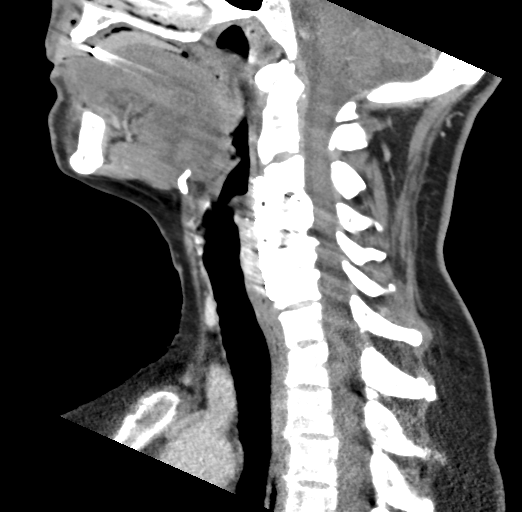
[im 38/75  bone]
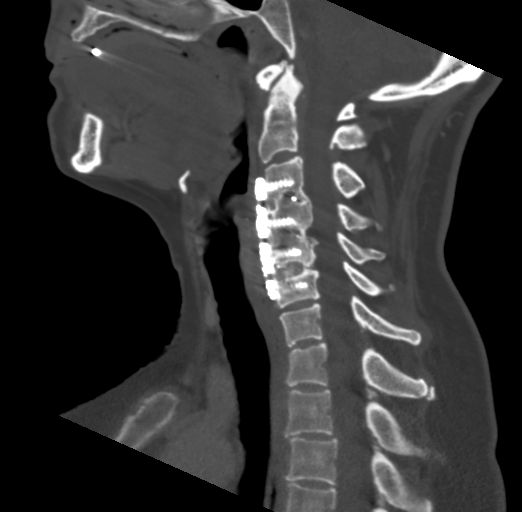
[im 44/75  bone]
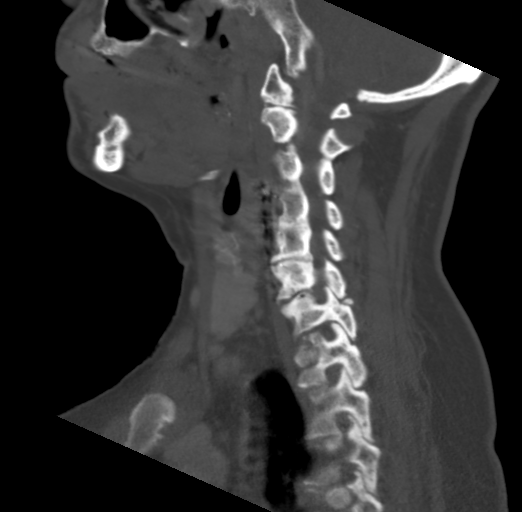
[im 50/75  bone]
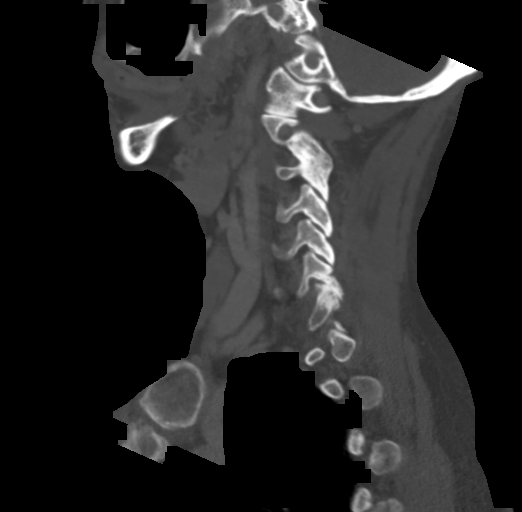

[Series 7: cor neck · coronal · 0.33mm/px · 3 of 88 slices shown]
[im 27/88  bone]
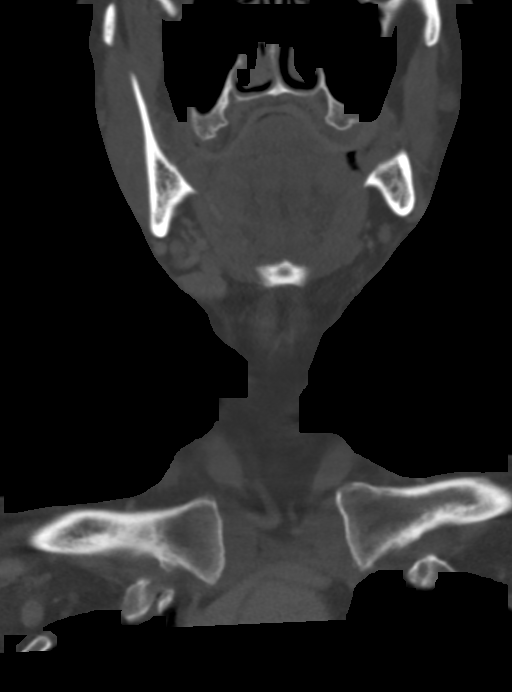
[im 38/88  bone]
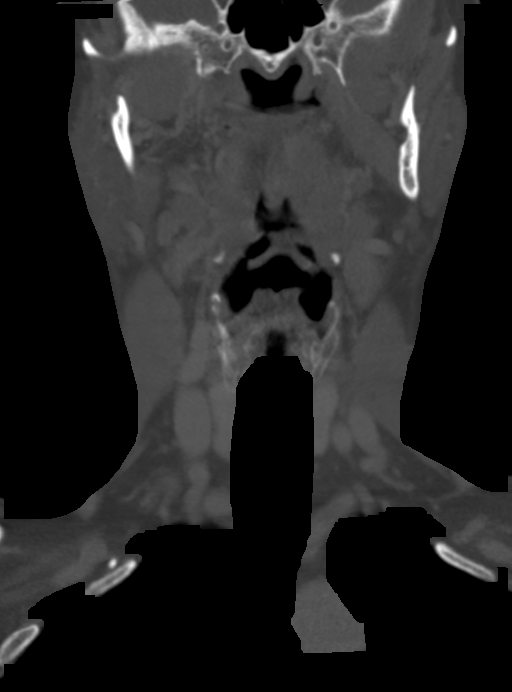
[im 50/88  bone]
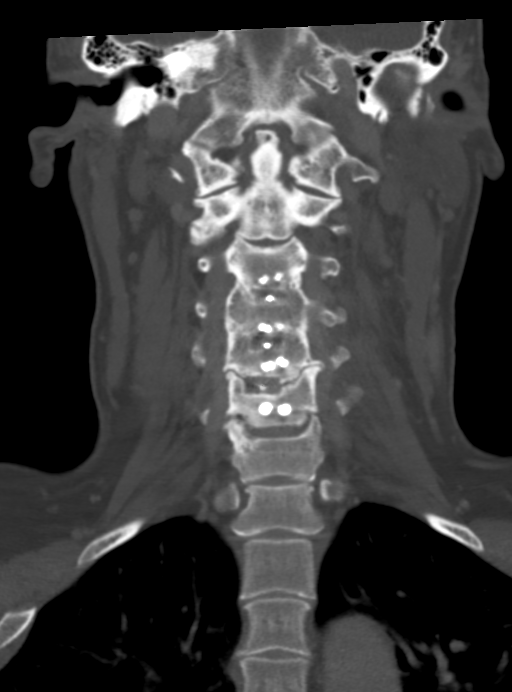

[Series 8: orthogonal ax · axial · 0.34mm/px · z∈[-30,+49]mm · 3 of 112 slices shown]
[im 23/112  bone]
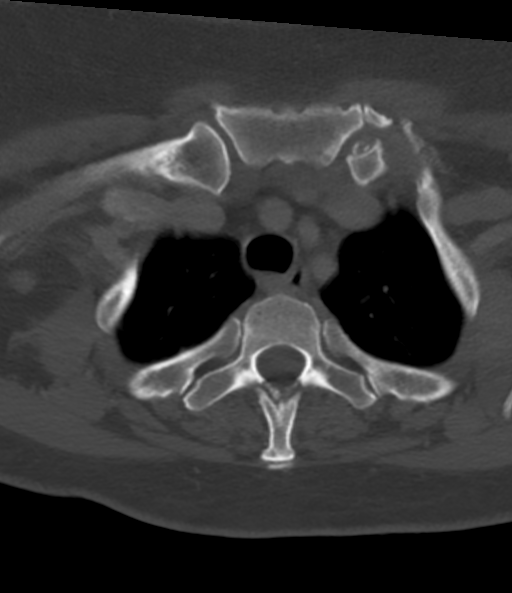
[im 45/112  bone]
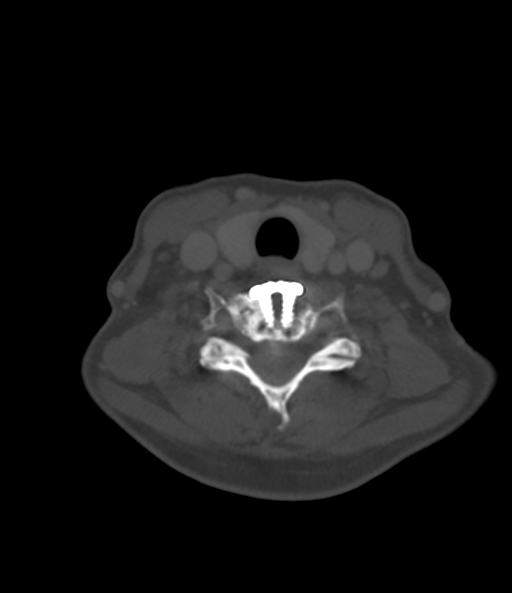
[im 67/112  bone]
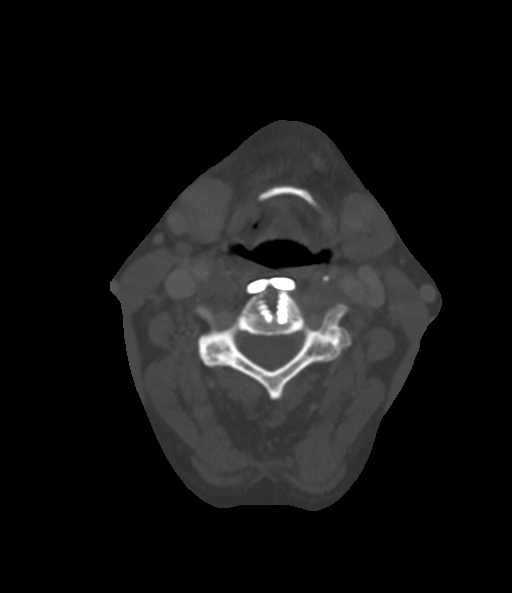

[15 of 33 positions shown; findings below may reference images not displayed]

FINDINGS: Pharynx and larynx: Oral cavity within normal limits without mass
lesion or loculated fluid collection. Patient is edentulous. No
abnormality at the base of tongue, although evaluation mildly
limited by motion artifact. Palatine tonsils symmetric in size
without acute abnormality. No tonsillar or peritonsillar abscess.
Parapharyngeal fat maintained. Several calcified tonsilliths noted
bilaterally. Nasopharynx within normal limits. No retropharyngeal
collection. Epiglottis normal. Vallecula partially effaced by the
lingual tonsils but within normal limits. Remainder of the
hypopharynx and supraglottic larynx within normal limits. Slight
asymmetry within the true cords with slight ballooning of the left
true cord could reflect a mild vocal cord palsy, not entirely
certain, but felt to be incidental in nature and of doubtful
clinical significance in the acute setting. Glottis otherwise
unremarkable. Subglottic airway widely patent and clear.

Salivary glands: Salivary glands including the parotid glands and
submandibular glands are within normal limits.

Thyroid: Subcentimeter hypodense right thyroid nodule noted, of
doubtful significance. Thyroid otherwise unremarkable.

Lymph nodes: Mildly prominent left level IIa node measures at the
upper limits of normal at 1 cm in short axis. No pathologically
enlarged lymph nodes identified within the neck.

Vascular: Normal intravascular enhancement seen throughout the neck.
Mild scattered atheromatous change about the aortic arch and carotid
bifurcations.

Limited intracranial: Unremarkable.

Visualized orbits: Unremarkable.

Mastoids and visualized paranasal sinuses: Visualized paranasal
sinuses are clear. Visualized mastoid air cells and middle ear
cavities are well pneumatized and free of fluid.

Skeleton: No acute osseus abnormality. No discrete lytic or blastic
osseous lesions. Prior ACDF at C3 through C6.

Upper chest: Visualized upper chest demonstrates no acute finding.
Partially visualized lungs are grossly clear.

Other: None.
IMPRESSION: 1. No CT evidence for acute abnormality within the neck. No acute
inflammatory changes, adenopathy, or mass lesion.
2. Prior ACDF at C3 through C6.

## 2020-01-02 ENCOUNTER — Other Ambulatory Visit: Payer: Self-pay | Admitting: Family Medicine

## 2020-01-04 ENCOUNTER — Other Ambulatory Visit: Payer: Self-pay | Admitting: Family Medicine

## 2020-01-04 DIAGNOSIS — Z1231 Encounter for screening mammogram for malignant neoplasm of breast: Secondary | ICD-10-CM

## 2020-02-08 ENCOUNTER — Ambulatory Visit
Admission: RE | Admit: 2020-02-08 | Discharge: 2020-02-08 | Disposition: A | Discharge status: Home / Self Care | Payer: Medicare Other | Source: Ambulatory Visit | Attending: Family Medicine | Admitting: Family Medicine

## 2020-02-08 ENCOUNTER — Other Ambulatory Visit: Payer: Self-pay

## 2020-02-08 DIAGNOSIS — Z1231 Encounter for screening mammogram for malignant neoplasm of breast: Secondary | ICD-10-CM

## 2020-04-29 ENCOUNTER — Ambulatory Visit
Admission: RE | Admit: 2020-04-29 | Discharge: 2020-04-29 | Disposition: A | Discharge status: Home / Self Care | Payer: Medicare Other | Source: Ambulatory Visit | Attending: Family Medicine | Admitting: Family Medicine

## 2020-04-29 ENCOUNTER — Ambulatory Visit
Admission: EM | Admit: 2020-04-29 | Discharge: 2020-04-29 | Disposition: A | Discharge status: Home / Self Care | Payer: Medicare Other | Attending: Family Medicine | Admitting: Family Medicine

## 2020-04-29 ENCOUNTER — Other Ambulatory Visit: Payer: Self-pay

## 2020-04-29 DIAGNOSIS — R634 Abnormal weight loss: Secondary | ICD-10-CM

## 2020-04-29 DIAGNOSIS — R1010 Upper abdominal pain, unspecified: Secondary | ICD-10-CM | POA: Insufficient documentation

## 2020-04-29 DIAGNOSIS — R1013 Epigastric pain: Secondary | ICD-10-CM

## 2020-04-29 LAB — TROPONIN I (HIGH SENSITIVITY): Troponin I (High Sensitivity): 3 ng/L (ref <18)

## 2020-04-29 LAB — COMPREHENSIVE METABOLIC PANEL
ALT: 16 U/L (ref 0–44)
AST: 22 U/L (ref 15–41)
Albumin: 3.9 g/dL (ref 3.5–5.0)
Alkaline Phosphatase: 43 U/L (ref 38–126)
Anion gap: 10 (ref 5–15)
BUN: 12 mg/dL (ref 8–23)
CO2: 28 mmol/L (ref 22–32)
Calcium: 9.5 mg/dL (ref 8.9–10.3)
Chloride: 98 mmol/L (ref 98–111)
Creatinine, Ser: 0.74 mg/dL (ref 0.44–1.00)
GFR, Estimated: >60 mL/min (ref >60)
Glucose, Bld: 92 mg/dL (ref 70–99)
Potassium: 4.2 mmol/L (ref 3.5–5.1)
Sodium: 136 mmol/L (ref 135–145)
Total Bilirubin: 0.8 mg/dL (ref 0.3–1.2)
Total Protein: 7.4 g/dL (ref 6.5–8.1)

## 2020-04-29 LAB — CBC WITH DIFFERENTIAL/PLATELET
Abs Immature Granulocytes: 0.02 10*3/uL (ref 0.00–0.07)
Basophils Absolute: 0 10*3/uL (ref 0.0–0.1)
Basophils Relative: 1 %
Eosinophils Absolute: 0.1 10*3/uL (ref 0.0–0.5)
Eosinophils Relative: 1 %
HCT: 38.5 % (ref 36.0–46.0)
Hemoglobin: 12.7 g/dL (ref 12.0–15.0)
Immature Granulocytes: 0 %
Lymphocytes Relative: 24 %
Lymphs Abs: 1.2 10*3/uL (ref 0.7–4.0)
MCH: 28.5 pg (ref 26.0–34.0)
MCHC: 33 g/dL (ref 30.0–36.0)
MCV: 86.3 fL (ref 80.0–100.0)
Monocytes Absolute: 0.9 10*3/uL (ref 0.1–1.0)
Monocytes Relative: 18 %
Neutro Abs: 2.9 10*3/uL (ref 1.7–7.7)
Neutrophils Relative %: 56 %
Platelets: 281 10*3/uL (ref 150–400)
RBC: 4.46 MIL/uL (ref 3.87–5.11)
RDW: 12.8 % (ref 11.5–15.5)
WBC: 5.1 10*3/uL (ref 4.0–10.5)
nRBC: 0 % (ref 0.0–0.2)

## 2020-04-29 LAB — URINALYSIS, COMPLETE (UACMP) WITH MICROSCOPIC
Bacteria, UA: NONE SEEN
Bilirubin Urine: NEGATIVE
Glucose, UA: NEGATIVE mg/dL
Hgb urine dipstick: NEGATIVE
Ketones, ur: NEGATIVE mg/dL
Leukocytes,Ua: NEGATIVE
Nitrite: NEGATIVE
Protein, ur: NEGATIVE mg/dL
Specific Gravity, Urine: 1.01 (ref 1.005–1.030)
WBC, UA: NONE SEEN WBC/hpf (ref 0–5)
pH: 6.5 (ref 5.0–8.0)

## 2020-04-29 LAB — LIPASE, BLOOD: Lipase: 32 U/L (ref 11–51)

## 2020-04-29 LAB — VITAMIN B12: Vitamin B-12: 1012 pg/mL — ABNORMAL HIGH (ref 180–914)

## 2020-04-29 NOTE — Discharge Instructions (Signed)
I will call with the results.  So far everything looks good.  Take care  Dr. Adriana Simas

## 2020-04-29 NOTE — ED Provider Notes (Signed)
[facility]    CSN: [id number] Arrival date & time: 04/29/20  1143      History   Chief Complaint Chief Complaint  Patient presents with  . Fatigue  . Weight Loss   HPI  64 year old female presents for evaluation the above.  Patient's husband is very concerned.  Patient states that she has had ongoing weight loss.  Husband confirms.  Weight loss has been going on for the past 3 to 4 months.  Unintentionally.  She has went from approximate 170 pounds down to 140 pounds per her report.  She states that she has changed her diet but has not really trying to lose weight.  Husband also states that she seems to have some memory difficulties.  Seems to have issues with her short-term memory.  He also reports that she seems to be fatigued.  Patient notes a decrease in appetite.  She has seen her primary care physician recently but did not inform her of the above complaints.  Additionally, yesterday patient was helping her husband move some wood and subsequently developed diffuse pain and got very flushed.  No diaphoresis.  Denies chest pain or shortness of breath.  Husband states that she seems to be more fatigued.  Was not herself today and missed church which is atypical for her.  No medication changes.  Also, patient has been taking Rolaids quite frequently over the past week.  Patient states that she is using it for heartburn.  Past Medical History:  Diagnosis Date  . Arthritis   . Chicken pox   . Chronic neck pain    DDD/spondylosis/stenosis  . Dry eyes    dry eyes d/t use of contacts  . Hyperlipidemia    takes Atorvastatin daily  . Hypertension   . IBS (irritable bowel syndrome)   . Insomnia   . Nocturia   . Urinary frequency   . Urinary incontinence   . Urinary urgency     Patient Active Problem List   Diagnosis Date Noted  . DM2 (diabetes mellitus, type 2) ([place]) 03/18/2017  . Insomnia 04/17/2016  . Allergy 04/17/2016  . Esophageal reflux 04/06/2015  . HTN  (hypertension) 05/10/2013  . Chronic low back pain 04/14/2012  . Urinary incontinence 04/14/2012  . Hyperlipidemia 04/14/2012    Past Surgical History:  Procedure Laterality Date  . ABDOMINAL HYSTERECTOMY     30 years ago  . ANTERIOR CERVICAL DECOMP/DISCECTOMY FUSION  05/17/2012   Procedure: ANTERIOR CERVICAL DECOMPRESSION/DISCECTOMY FUSION 3 LEVELS;  Surgeon: Elaina Hoops, MD;  Location: [facility];  Service: Neurosurgery;  Laterality: Bilateral;  Cervical three-four, four-five, five-six anterior cervical discectomy with fusion  . back pain     hx of buldging disc  . BACK SURGERY     x 2  . CARPAL TUNNEL RELEASE    . COLONOSCOPY  17 years ago  . COLONOSCOPY WITH PROPOFOL N/A 05/22/2015   Procedure: COLONOSCOPY WITH PROPOFOL;  Surgeon: Robert Bellow, MD;  Location: [facility]COPY;  Service: Endoscopy;  Laterality: N/A;  . ESOPHAGOGASTRODUODENOSCOPY    . FOOT SURGERY     d/t hammer toe-bil  . hammer toe    . LUMBAR LAMINECTOMY  1989, 1991   [facility], Dr. Rolin Barry  . SHOULDER ARTHROSCOPY Right 01/2013   Dr. Lennette Bihari [facility] - [facility]  . TUBAL LIGATION    . VAGINAL DELIVERY      OB History    Gravida  1   Para  1   Term  Preterm      AB      Living  1     SAB      TAB      Ectopic      Multiple      Live Births           Obstetric Comments  Menstrual age: 39  Age 1st Pregnancy: 19          Home Medications    Prior to Admission medications   Medication Sig Start Date End Date Taking? Authorizing Provider  Aloe LIQD Take by mouth as needed.    [provider]  blood glucose meter kit and supplies KIT Dispense based on patient and insurance preference. Check blood sugar once daily fasting. ICD 10 code E11.9. 04/15/17   Leone Haven, MD  hydrochlorothiazide (HYDRODIURIL) 25 MG tablet TAKE 1 TABLET (25 MG TOTAL) BY MOUTH DAILY. 03/26/17   Einar Pheasant, MD  atorvastatin (LIPITOR) 40 MG tablet TAKE 1 TABLET EVERY DAY  03/26/17 04/29/20  Einar Pheasant, MD  metFORMIN (GLUCOPHAGE) 500 MG tablet Take 1 tablet (500 mg total) by mouth 2 (two) times daily with a meal. 03/30/17 04/29/20  Sharif Rendell, Michael Boston G, DO  temazepam (RESTORIL) 15 MG capsule Take 1 capsule (15 mg total) by mouth at bedtime as needed. 01/20/17 04/29/20  Coral Spikes, DO    Family History Family History  Problem Relation Age of Onset  . Heart disease Mother   . Diabetes Mother   . Heart disease Father   . Diabetes Father   . Diabetes Brother   . Depression Brother   . Breast cancer Neg Hx     Social History Social History   Tobacco Use  . Smoking status: Former Research scientist (life sciences)  . Smokeless tobacco: Never Used  . Tobacco comment: quit smoking many yrs ago  Substance Use Topics  . Alcohol use: No    Alcohol/week: 0.0 standard drinks  . Drug use: No     Allergies   Hydrocodone, Tramadol, and Oxycodone   Review of Systems Review of Systems  Constitutional: Positive for appetite change and unexpected weight change.  Respiratory: Negative.    Physical Exam Triage Vital Signs ED Triage Vitals  Enc Vitals Group     BP 04/29/20 1156 (!) 139/91     Pulse Rate 04/29/20 1156 86     Resp 04/29/20 1156 18     Temp 04/29/20 1156 98.3 F (36.8 C)     Temp Source 04/29/20 1156 Oral     SpO2 04/29/20 1156 100 %     Weight 04/29/20 1157 140 lb (63.5 kg)     Height 04/29/20 1157 5' 9"  (7.342 m)     Head Circumference --      Peak Flow --      Pain Score 04/29/20 1155 3     Pain Loc --      Pain Edu? --      Excl. in [place]? --    Updated Vital Signs BP (!) 139/91 (BP Location: Left Arm)   Pulse 86   Temp 98.3 F (36.8 C) (Oral)   Resp 18   Ht 5' 9"  (1.753 m)   Wt 63.5 kg   SpO2 100%   BMI 20.67 kg/m   Visual Acuity Right Eye Distance:   Left Eye Distance:   Bilateral Distance:    Right Eye Near:   Left Eye Near:    Bilateral Near:     Physical Exam Vitals  and nursing note reviewed.  Constitutional:      General: She is not  in acute distress.    Appearance: Normal appearance. She is not ill-appearing.  HENT:     Head: Normocephalic and atraumatic.  Eyes:     General:        Right eye: No discharge.        Left eye: No discharge.     Conjunctiva/sclera: Conjunctivae normal.  Cardiovascular:     Rate and Rhythm: Normal rate and regular rhythm.     Heart sounds: No murmur heard.   Pulmonary:     Effort: Pulmonary effort is normal.     Breath sounds: Normal breath sounds. No wheezing, rhonchi or rales.  Abdominal:     General: There is no distension.     Palpations: Abdomen is soft.     Comments: Epigastric and RUQ tenderness to palpation.  Neurological:     Mental Status: She is alert.  Psychiatric:        Mood and Affect: Mood normal.        Behavior: Behavior normal.    UC Treatments / Results  Labs (all labs ordered are listed, but only abnormal results are displayed) Labs Reviewed  CBC WITH DIFFERENTIAL/PLATELET  COMPREHENSIVE METABOLIC PANEL  LIPASE, BLOOD  URINALYSIS, COMPLETE (UACMP) WITH MICROSCOPIC  TSH  VITAMIN B12  TROPONIN I (HIGH SENSITIVITY)  TROPONIN I (HIGH SENSITIVITY)    EKG Interpretation: Normal sinus rhythm with rate of 70.  Normal axis.  No ST or T wave changes.  Normal EKG.  Radiology US Abdomen Complete  Result Date: 04/29/2020 CLINICAL DATA:  64 year old female with UPPER abdominal pain. EXAM: ABDOMEN ULTRASOUND COMPLETE COMPARISON:  None. FINDINGS: Gallbladder: The gallbladder is unremarkable. There is no evidence cholelithiasis or acute cholecystitis. Common bile duct: Diameter: 3 mm. No intrahepatic or extrahepatic biliary dilatation. Liver: No focal lesion identified. Within normal limits in parenchymal echogenicity. Portal vein is patent on color Doppler imaging with normal direction of blood flow towards the liver. IVC: No abnormality visualized. Pancreas: Visualized portion unremarkable. Spleen: Size and appearance within normal limits. Right Kidney: Length: 9  cm. Echogenicity within normal limits. No mass or hydronephrosis visualized. Left Kidney: Length: 11.1 cm. Echogenicity within normal limits. No mass or hydronephrosis visualized. Abdominal aorta: No aneurysm visualized. Other findings: None. IMPRESSION: 1. Unremarkable abdominal ultrasound. Electronically Signed   By: Margarette Canada M.D.   On: 04/29/2020 14:32    Procedures Procedures (including critical care time)  Medications Ordered in UC Medications - No data to display  Initial Impression / Assessment and Plan / UC Course  I have reviewed the triage vital signs and the nursing notes.  Pertinent labs & imaging results that were available during my care of the patient were reviewed by me and considered in my medical decision making (see chart for details).    64 year old female presents with unintentional weight loss and upper abdominal pain on exam.  Etiology for her symptoms and prognosis remains unclear after thorough work-up today.  CBC normal.  CMP normal.  EKG unremarkable.  Troponin negative.  Urine clear.  Abdominal ultrasound normal as well.  Advised close follow-up with primary care physician.  Final Clinical Impressions(s) / UC Diagnoses   Final diagnoses:  Unintentional weight loss  Abdominal pain, epigastric     Discharge Instructions     I will call with the results.  So far everything looks good.  Take care  Dr. Lacinda Axon     ED  Prescriptions    None     PDMP not reviewed this encounter.   Coral Spikes, [place] 04/29/20 1447

## 2020-04-29 NOTE — ED Triage Notes (Addendum)
Pt with fatigue, weight loss, and "memory issues." Sx x past few months and PCP isn't concerned. Yesterday was carrying some wood and the exertion caused pain down her arms and all over her body and then got cold and turned red in her face. Pt states she has pain all over her body most days.  Pt has next PCP appointment 2nd week of November. Had blood work done last week. Pt has "bug bites" on her chest for past 3 days, itchy and red

## 2021-01-10 ENCOUNTER — Other Ambulatory Visit: Payer: Self-pay | Admitting: Family Medicine

## 2021-01-10 DIAGNOSIS — Z1231 Encounter for screening mammogram for malignant neoplasm of breast: Secondary | ICD-10-CM

## 2021-02-11 ENCOUNTER — Other Ambulatory Visit: Payer: Self-pay

## 2021-02-11 ENCOUNTER — Ambulatory Visit
Admission: RE | Admit: 2021-02-11 | Discharge: 2021-02-11 | Disposition: A | Discharge status: Home / Self Care | Payer: Medicare Other | Source: Ambulatory Visit | Attending: Family Medicine | Admitting: Family Medicine

## 2021-02-11 DIAGNOSIS — Z1231 Encounter for screening mammogram for malignant neoplasm of breast: Secondary | ICD-10-CM | POA: Diagnosis present

## 2022-02-26 ENCOUNTER — Other Ambulatory Visit: Payer: Self-pay | Admitting: Family Medicine

## 2022-02-26 DIAGNOSIS — Z780 Asymptomatic menopausal state: Secondary | ICD-10-CM

## 2022-02-26 DIAGNOSIS — Z1231 Encounter for screening mammogram for malignant neoplasm of breast: Secondary | ICD-10-CM

## 2022-04-14 ENCOUNTER — Inpatient Hospital Stay: Admission: RE | Admit: 2022-04-14 | Payer: Medicare Other | Source: Ambulatory Visit

## 2022-04-14 ENCOUNTER — Other Ambulatory Visit: Payer: Medicare Other

## 2022-05-13 ENCOUNTER — Other Ambulatory Visit: Payer: Self-pay | Admitting: Neurosurgery

## 2022-05-13 DIAGNOSIS — M5412 Radiculopathy, cervical region: Secondary | ICD-10-CM

## 2022-05-14 ENCOUNTER — Ambulatory Visit
Admission: RE | Admit: 2022-05-14 | Discharge: 2022-05-14 | Disposition: A | Discharge status: Home / Self Care | Payer: Medicare Other | Source: Ambulatory Visit | Attending: Neurosurgery | Admitting: Neurosurgery

## 2022-05-14 DIAGNOSIS — M5412 Radiculopathy, cervical region: Secondary | ICD-10-CM

## 2022-05-19 ENCOUNTER — Other Ambulatory Visit: Payer: Self-pay | Admitting: Neurosurgery

## 2022-05-20 NOTE — Pre-Procedure Instructions (Signed)
Surgical Instructions    Your procedure is scheduled on May 28, 2022.  Report to [facility] Main Entrance "A" at 5:30 A.M., then check in with the Admitting office.  Call this number if you have problems the morning of surgery:  [phone]   If you have any questions prior to your surgery date call [phone]: Open Monday-Friday 8am-4pm If you experience any cold or flu symptoms such as cough, fever, chills, shortness of breath, etc. between now and your scheduled surgery, please notify us at the above number     Remember:  Do not eat after midnight the night before your surgery  You may drink clear liquids until 4:30 AM the morning of your surgery.   Clear liquids allowed are: Water, Non-Citrus Juices (without pulp), Carbonated Beverages, Clear Tea, Black Coffee Only (NO MILK, CREAM OR POWDERED CREAMER of any kind), and Gatorade.     Take these medicines the morning of surgery with A SIP OF WATER:  amLODipine (NORVASC)   Docusate Sodium (STOOL SOFTENER)   fluticasone (FLONASE)   oxybutynin (DITROPAN-XL)    Take these medicines the morning of surgery with a sip of water AS NEEDED:  acetaminophen (TYLENOL)   cyclobenzaprine (FLEXERIL)   As of today, STOP taking any Aspirin (unless otherwise instructed by your surgeon) Aleve, Naproxen, Ibuprofen, Motrin, Advil, Goody's, BC's, all herbal medications, fish oil, and all vitamins.                     Do NOT Smoke (Tobacco/Vaping) for 24 hours prior to your procedure.  If you use a CPAP at night, you may bring your mask/headgear for your overnight stay.   Contacts, glasses, piercing's, hearing aid's, dentures or partials may not be worn into surgery, please bring cases for these belongings.    For patients admitted to the hospital, discharge time will be determined by your treatment team.   Patients discharged the day of surgery will not be allowed to drive home, and someone needs to stay with them for 24 hours.  SURGICAL  WAITING ROOM VISITATION Patients having surgery or a procedure may have no more than 2 support people in the waiting area - these visitors may rotate.   Children under the age of 88 must have an adult with them who is not the patient. If the patient needs to stay at the hospital during part of their recovery, the visitor guidelines for inpatient rooms apply. Pre-op nurse will coordinate an appropriate time for 1 support person to accompany patient in pre-op.  This support person may not rotate.   Please refer to the [facility] website for the visitor guidelines for Inpatients (after your surgery is over and you are in a regular room).    Special instructions:   [place]- Preparing For Surgery  Before surgery, you can play an important role. Because skin is not sterile, your skin needs to be as free of germs as possible. You can reduce the number of germs on your skin by washing with CHG (chlorahexidine gluconate) Soap before surgery.  CHG is an antiseptic cleaner which kills germs and bonds with the skin to continue killing germs even after washing.    Oral Hygiene is also important to reduce your risk of infection.  Remember - BRUSH YOUR TEETH THE MORNING OF SURGERY WITH YOUR REGULAR TOOTHPASTE  Please do not use if you have an allergy to CHG or antibacterial soaps. If your skin becomes reddened/irritated stop using the CHG.  Do  not shave (including legs and underarms) for at least 48 hours prior to first CHG shower. It is OK to shave your face.  Please follow these instructions carefully.   Shower the NIGHT BEFORE SURGERY and the MORNING OF SURGERY  If you chose to wash your hair, wash your hair first as usual with your normal shampoo.  After you shampoo, rinse your hair and body thoroughly to remove the shampoo.  Use CHG Soap as you would any other liquid soap. You can apply CHG directly to the skin and wash gently with a scrungie or a clean washcloth.   Apply the CHG Soap to your  body ONLY FROM THE NECK DOWN.  Do not use on open wounds or open sores. Avoid contact with your eyes, ears, mouth and genitals (private parts). Wash Face and genitals (private parts)  with your normal soap.   Wash thoroughly, paying special attention to the area where your surgery will be performed.  Thoroughly rinse your body with warm water from the neck down.  DO NOT shower/wash with your normal soap after using and rinsing off the CHG Soap.  Pat yourself dry with a CLEAN TOWEL.  Wear CLEAN PAJAMAS to bed the night before surgery  Place CLEAN SHEETS on your bed the night before your surgery  DO NOT SLEEP WITH PETS.   Day of Surgery: Take a shower with CHG soap. Do not wear jewelry or makeup Do not wear lotions, powders, perfumes/colognes, or deodorant. Do not shave 48 hours prior to surgery.  Men may shave face and neck. Do not bring valuables to the hospital.  [facility] is not responsible for any belongings or valuables. Do not wear nail polish, gel polish, artificial nails, or any other type of covering on natural nails (fingers and toes) If you have artificial nails or gel coating that need to be removed by a nail salon, please have this removed prior to surgery. Artificial nails or gel coating may interfere with anesthesia's ability to adequately monitor your vital signs.  Wear Clean/Comfortable clothing the morning of surgery Remember to brush your teeth WITH YOUR REGULAR TOOTHPASTE.   Please read over the following fact sheets that you were given.    If you received a COVID test during your pre-op visit  it is requested that you wear a mask when out in public, stay away from anyone that may not be feeling well and notify your surgeon if you develop symptoms. If you have been in contact with anyone that has tested positive in the last 10 days please notify you surgeon.

## 2022-05-21 ENCOUNTER — Encounter (HOSPITAL_COMMUNITY)
Admission: RE | Admit: 2022-05-21 | Discharge: 2022-05-21 | Disposition: A | Discharge status: Home / Self Care | Payer: Medicare Other | Source: Ambulatory Visit | Attending: Neurosurgery | Admitting: Neurosurgery

## 2022-05-21 ENCOUNTER — Other Ambulatory Visit: Payer: Self-pay

## 2022-05-21 ENCOUNTER — Encounter (HOSPITAL_COMMUNITY): Payer: Self-pay | Admitting: Neurosurgery

## 2022-05-21 VITALS — BP 124/81 | HR 70 | Temp 97.5°F | Resp 18 | Ht 68.0 in | Wt 138.5 lb

## 2022-05-21 DIAGNOSIS — Z01812 Encounter for preprocedural laboratory examination: Secondary | ICD-10-CM | POA: Diagnosis present

## 2022-05-21 DIAGNOSIS — I10 Essential (primary) hypertension: Secondary | ICD-10-CM | POA: Diagnosis not present

## 2022-05-21 LAB — TYPE AND SCREEN
ABO/RH(D): O POS
Antibody Screen: NEGATIVE

## 2022-05-21 LAB — CBC
HCT: 39.5 % (ref 36.0–46.0)
Hemoglobin: 12.6 g/dL (ref 12.0–15.0)
MCH: 29 pg (ref 26.0–34.0)
MCHC: 31.9 g/dL (ref 30.0–36.0)
MCV: 90.8 fL (ref 80.0–100.0)
Platelets: 317 10*3/uL (ref 150–400)
RBC: 4.35 MIL/uL (ref 3.87–5.11)
RDW: 12.7 % (ref 11.5–15.5)
WBC: 5.8 10*3/uL (ref 4.0–10.5)
nRBC: 0 % (ref 0.0–0.2)

## 2022-05-21 LAB — BASIC METABOLIC PANEL
Anion gap: 7 (ref 5–15)
BUN: 14 mg/dL (ref 8–23)
CO2: 27 mmol/L (ref 22–32)
Calcium: 9.5 mg/dL (ref 8.9–10.3)
Chloride: 105 mmol/L (ref 98–111)
Creatinine, Ser: 0.67 mg/dL (ref 0.44–1.00)
GFR, Estimated: >60 mL/min (ref >60)
Glucose, Bld: 103 mg/dL — ABNORMAL HIGH (ref 70–99)
Potassium: 4.2 mmol/L (ref 3.5–5.1)
Sodium: 139 mmol/L (ref 135–145)

## 2022-05-21 LAB — SURGICAL PCR SCREEN
MRSA, PCR: NEGATIVE
Staphylococcus aureus: NEGATIVE

## 2022-05-21 NOTE — Progress Notes (Signed)
PCP - Dr.Elena Adamo Cardiologist - pt denies  PPM/ICD -denies  Device Orders - n/a Rep Notified - n/a  Chest x-ray - n/a EKG - 05/21/22 Stress Test - denies ECHO - denies Cardiac Cath - denies  Sleep Study - denies CPAP - n/a  DM=patient denies, states "borderline"  Blood Thinner Instructions:denies Aspirin Instructions:denies  ERAS Protcol -yes PRE-SURGERY Ensure or G2- none  COVID TEST- n/a   Anesthesia review: No  Patient denies shortness of breath, fever, cough and chest pain at PAT appointment   All instructions explained to the patient, with a verbal understanding of the material. Patient agrees to go over the instructions while at home for a better understanding. The opportunity to ask questions was provided.

## 2022-05-28 ENCOUNTER — Inpatient Hospital Stay (HOSPITAL_COMMUNITY): Admission: RE | Admit: 2022-05-28 | Payer: Medicare Other | Source: Home / Self Care | Admitting: Neurosurgery

## 2022-05-28 DIAGNOSIS — I10 Essential (primary) hypertension: Secondary | ICD-10-CM

## 2022-05-28 DIAGNOSIS — Z01818 Encounter for other preprocedural examination: Secondary | ICD-10-CM

## 2022-05-28 HISTORY — DX: Gastro-esophageal reflux disease without esophagitis: K21.9

## 2022-05-28 SURGERY — ANTERIOR CERVICAL DECOMPRESSION/DISCECTOMY FUSION 1 LEVEL/HARDWARE REMOVAL
Anesthesia: General

## 2022-06-20 ENCOUNTER — Encounter (HOSPITAL_COMMUNITY): Payer: Self-pay | Admitting: Neurosurgery

## 2022-06-20 NOTE — Progress Notes (Signed)
Spoke with patient. Patient denies any changes in medical history and no changes in medication list since PAT visit on 05/21/22. Pt denies any cold/flu symptoms, no chest pain or SOB at this time. Patient has Pre-op instructions that was given to her at PAT visit-patient will follow all instructions. Went over new surgery date/time and arrival time with patient. No further questions from pt at this time.

## 2022-06-23 ENCOUNTER — Encounter (HOSPITAL_COMMUNITY): Payer: Self-pay | Admitting: Neurosurgery

## 2022-06-23 ENCOUNTER — Inpatient Hospital Stay (HOSPITAL_COMMUNITY)
Admission: RE | Disposition: A | Discharge status: Home / Self Care | Payer: Self-pay | Source: Home / Self Care | Attending: Neurosurgery

## 2022-06-23 ENCOUNTER — Inpatient Hospital Stay (HOSPITAL_COMMUNITY): Payer: Medicare Other

## 2022-06-23 ENCOUNTER — Inpatient Hospital Stay (HOSPITAL_COMMUNITY): Payer: Medicare Other | Admitting: Anesthesiology

## 2022-06-23 ENCOUNTER — Other Ambulatory Visit: Payer: Self-pay

## 2022-06-23 ENCOUNTER — Inpatient Hospital Stay (HOSPITAL_COMMUNITY)
Admission: RE | Admit: 2022-06-23 | Discharge: 2022-06-24 | DRG: 473 | Disposition: A | Discharge status: Home / Self Care | Payer: Medicare Other | Attending: Neurosurgery | Admitting: Neurosurgery

## 2022-06-23 DIAGNOSIS — E785 Hyperlipidemia, unspecified: Secondary | ICD-10-CM | POA: Diagnosis present

## 2022-06-23 DIAGNOSIS — K219 Gastro-esophageal reflux disease without esophagitis: Secondary | ICD-10-CM | POA: Diagnosis present

## 2022-06-23 DIAGNOSIS — M4722 Other spondylosis with radiculopathy, cervical region: Secondary | ICD-10-CM | POA: Diagnosis not present

## 2022-06-23 DIAGNOSIS — Z885 Allergy status to narcotic agent status: Secondary | ICD-10-CM

## 2022-06-23 DIAGNOSIS — G4700 Insomnia, unspecified: Secondary | ICD-10-CM | POA: Diagnosis present

## 2022-06-23 DIAGNOSIS — Z79899 Other long term (current) drug therapy: Secondary | ICD-10-CM

## 2022-06-23 DIAGNOSIS — K589 Irritable bowel syndrome without diarrhea: Secondary | ICD-10-CM | POA: Diagnosis present

## 2022-06-23 DIAGNOSIS — Z833 Family history of diabetes mellitus: Secondary | ICD-10-CM | POA: Diagnosis not present

## 2022-06-23 DIAGNOSIS — I10 Essential (primary) hypertension: Secondary | ICD-10-CM | POA: Diagnosis present

## 2022-06-23 DIAGNOSIS — Z8249 Family history of ischemic heart disease and other diseases of the circulatory system: Secondary | ICD-10-CM | POA: Diagnosis not present

## 2022-06-23 DIAGNOSIS — M4802 Spinal stenosis, cervical region: Secondary | ICD-10-CM

## 2022-06-23 DIAGNOSIS — Z87891 Personal history of nicotine dependence: Secondary | ICD-10-CM

## 2022-06-23 HISTORY — PX: ANTERIOR CERVICAL DECOMP/DISCECTOMY FUSION: SHX1161

## 2022-06-23 LAB — TYPE AND SCREEN
ABO/RH(D): O POS
Antibody Screen: NEGATIVE

## 2022-06-23 SURGERY — ANTERIOR CERVICAL DECOMPRESSION/DISCECTOMY FUSION 1 LEVEL/HARDWARE REMOVAL
Anesthesia: General

## 2022-06-23 MED ORDER — THROMBIN 5000 UNITS EX SOLR
CUTANEOUS | Status: AC
  Filled 2022-06-23: qty 15000

## 2022-06-23 MED ORDER — HYDROMORPHONE HCL 1 MG/ML IJ SOLN
INTRAMUSCULAR | Status: AC
  Filled 2022-06-23: qty 1

## 2022-06-23 MED ORDER — ONDANSETRON HCL 4 MG PO TABS: 4.0000 mg | ORAL_TABLET | Freq: Four times a day (QID) | ORAL | Status: DC | PRN

## 2022-06-23 MED ORDER — VITAMIN D3 25 MCG (1000 UNIT) PO TABS
5000.0000 [IU] | ORAL_TABLET | Freq: Every day | ORAL | Status: DC
  Filled 2022-06-23: qty 5

## 2022-06-23 MED ORDER — BIOTIN W/ VITAMINS C & E 1250-7.5-7.5 MCG-MG-UNT PO CHEW: CHEWABLE_TABLET | Freq: Every day | ORAL | Status: DC

## 2022-06-23 MED ORDER — MUPIROCIN 2 % EX OINT
1.0000 | TOPICAL_OINTMENT | Freq: Two times a day (BID) | CUTANEOUS | Status: DC
  Administered 2022-06-23: 1 via NASAL
  Filled 2022-06-23: qty 22

## 2022-06-23 MED ORDER — LIDOCAINE 2% (20 MG/ML) 5 ML SYRINGE
INTRAMUSCULAR | Status: DC | PRN
  Administered 2022-06-23: 100 mg via INTRAVENOUS

## 2022-06-23 MED ORDER — ONDANSETRON HCL 4 MG/2ML IJ SOLN: 4.0000 mg | Freq: Four times a day (QID) | INTRAMUSCULAR | Status: DC | PRN

## 2022-06-23 MED ORDER — DEXAMETHASONE SODIUM PHOSPHATE 10 MG/ML IJ SOLN
INTRAMUSCULAR | Status: DC | PRN
  Administered 2022-06-23: 10 mg via INTRAVENOUS

## 2022-06-23 MED ORDER — CEFAZOLIN SODIUM-DEXTROSE 2-4 GM/100ML-% IV SOLN
INTRAVENOUS | Status: AC
  Filled 2022-06-23: qty 100

## 2022-06-23 MED ORDER — SODIUM CHLORIDE 0.9% FLUSH: 3.0000 mL | INTRAVENOUS | Status: DC | PRN

## 2022-06-23 MED ORDER — LACTATED RINGERS IV SOLN: INTRAVENOUS | Status: DC | PRN

## 2022-06-23 MED ORDER — PANTOPRAZOLE SODIUM 40 MG PO TBEC
40.0000 mg | DELAYED_RELEASE_TABLET | Freq: Every day | ORAL | Status: DC
  Administered 2022-06-23: 40 mg via ORAL
  Filled 2022-06-23: qty 1

## 2022-06-23 MED ORDER — CHLORHEXIDINE GLUCONATE 0.12 % MT SOLN
15.0000 mL | Freq: Once | OROMUCOSAL | Status: AC
  Administered 2022-06-23: 15 mL via OROMUCOSAL
  Filled 2022-06-23: qty 15

## 2022-06-23 MED ORDER — CYCLOBENZAPRINE HCL 10 MG PO TABS
10.0000 mg | ORAL_TABLET | Freq: Three times a day (TID) | ORAL | Status: DC | PRN
  Administered 2022-06-23: 10 mg via ORAL
  Filled 2022-06-23: qty 1

## 2022-06-23 MED ORDER — KETOROLAC TROMETHAMINE 30 MG/ML IJ SOLN: 30.0000 mg | Freq: Once | INTRAMUSCULAR | Status: DC | PRN

## 2022-06-23 MED ORDER — HYDROMORPHONE HCL 1 MG/ML IJ SOLN: 0.5000 mg | INTRAMUSCULAR | Status: DC | PRN

## 2022-06-23 MED ORDER — OXYBUTYNIN CHLORIDE ER 5 MG PO TB24
5.0000 mg | ORAL_TABLET | Freq: Every morning | ORAL | Status: DC
  Filled 2022-06-23: qty 1

## 2022-06-23 MED ORDER — PHENYLEPHRINE HCL-NACL 20-0.9 MG/250ML-% IV SOLN
INTRAVENOUS | Status: DC | PRN
  Administered 2022-06-23: 20 ug/min via INTRAVENOUS

## 2022-06-23 MED ORDER — ORAL CARE MOUTH RINSE: 15.0000 mL | Freq: Once | OROMUCOSAL | Status: AC

## 2022-06-23 MED ORDER — PROPOFOL 10 MG/ML IV BOLUS
INTRAVENOUS | Status: DC | PRN
  Administered 2022-06-23: 50 mg via INTRAVENOUS
  Administered 2022-06-23: 130 mg via INTRAVENOUS

## 2022-06-23 MED ORDER — ACETAMINOPHEN 650 MG RE SUPP: 650.0000 mg | RECTAL | Status: DC | PRN

## 2022-06-23 MED ORDER — MIDAZOLAM HCL 2 MG/2ML IJ SOLN
INTRAMUSCULAR | Status: AC
  Filled 2022-06-23: qty 2

## 2022-06-23 MED ORDER — DOCUSATE SODIUM 100 MG PO CAPS: 100.0000 mg | ORAL_CAPSULE | ORAL | Status: DC

## 2022-06-23 MED ORDER — HYDROCODONE-ACETAMINOPHEN 5-325 MG PO TABS
2.0000 | ORAL_TABLET | ORAL | Status: DC | PRN
  Administered 2022-06-23 – 2022-06-24 (×4): 2 via ORAL
  Filled 2022-06-23 (×4): qty 2

## 2022-06-23 MED ORDER — AMLODIPINE BESYLATE 5 MG PO TABS: 5.0000 mg | ORAL_TABLET | Freq: Every day | ORAL | Status: DC

## 2022-06-23 MED ORDER — FENTANYL CITRATE (PF) 250 MCG/5ML IJ SOLN
INTRAMUSCULAR | Status: DC | PRN
  Administered 2022-06-23: 50 ug via INTRAVENOUS
  Administered 2022-06-23: 100 ug via INTRAVENOUS
  Administered 2022-06-23 (×2): 50 ug via INTRAVENOUS

## 2022-06-23 MED ORDER — MENTHOL 3 MG MT LOZG
1.0000 | LOZENGE | OROMUCOSAL | Status: DC | PRN
  Administered 2022-06-24: 3 mg via ORAL
  Filled 2022-06-23: qty 9

## 2022-06-23 MED ORDER — ACETAMINOPHEN 325 MG PO TABS: 650.0000 mg | ORAL_TABLET | ORAL | Status: DC | PRN

## 2022-06-23 MED ORDER — FLUTICASONE PROPIONATE 50 MCG/ACT NA SUSP
1.0000 | Freq: Every day | NASAL | Status: DC
  Filled 2022-06-23: qty 16

## 2022-06-23 MED ORDER — CEFAZOLIN SODIUM-DEXTROSE 2-3 GM-%(50ML) IV SOLR
INTRAVENOUS | Status: DC | PRN
  Administered 2022-06-23: 2 g via INTRAVENOUS

## 2022-06-23 MED ORDER — TRIAMCINOLONE ACETONIDE 0.1 % EX CREA: 1.0000 | TOPICAL_CREAM | Freq: Two times a day (BID) | CUTANEOUS | Status: DC | PRN

## 2022-06-23 MED ORDER — ACETAMINOPHEN 10 MG/ML IV SOLN
INTRAVENOUS | Status: DC | PRN
  Administered 2022-06-23: 1000 mg via INTRAVENOUS

## 2022-06-23 MED ORDER — LACTATED RINGERS IV SOLN: INTRAVENOUS | Status: DC

## 2022-06-23 MED ORDER — CEFAZOLIN SODIUM-DEXTROSE 2-4 GM/100ML-% IV SOLN
2.0000 g | Freq: Three times a day (TID) | INTRAVENOUS | Status: AC
  Administered 2022-06-23 – 2022-06-24 (×2): 2 g via INTRAVENOUS
  Filled 2022-06-23 (×2): qty 100

## 2022-06-23 MED ORDER — ONDANSETRON HCL 4 MG/2ML IJ SOLN: 4.0000 mg | Freq: Once | INTRAMUSCULAR | Status: DC | PRN

## 2022-06-23 MED ORDER — THROMBIN 5000 UNITS EX SOLR
CUTANEOUS | Status: AC
  Filled 2022-06-23: qty 5000

## 2022-06-23 MED ORDER — CHLORHEXIDINE GLUCONATE CLOTH 2 % EX PADS: 6.0000 | MEDICATED_PAD | Freq: Every day | CUTANEOUS | Status: DC

## 2022-06-23 MED ORDER — PANTOPRAZOLE SODIUM 40 MG IV SOLR: 40.0000 mg | Freq: Every day | INTRAVENOUS | Status: DC

## 2022-06-23 MED ORDER — ROCURONIUM BROMIDE 10 MG/ML (PF) SYRINGE
PREFILLED_SYRINGE | INTRAVENOUS | Status: AC
  Filled 2022-06-23: qty 10

## 2022-06-23 MED ORDER — NAPHAZOLINE-GLYCERIN-ZINC SULF 0.012-0.25-0.25 % OP SOLN: 1.0000 [drp] | Freq: Every day | OPHTHALMIC | Status: DC

## 2022-06-23 MED ORDER — SODIUM CHLORIDE 0.9% FLUSH
3.0000 mL | Freq: Two times a day (BID) | INTRAVENOUS | Status: DC
  Administered 2022-06-23: 3 mL via INTRAVENOUS

## 2022-06-23 MED ORDER — LIP BALM EX STCK: CUTANEOUS | Status: DC | PRN

## 2022-06-23 MED ORDER — POLYVINYL ALCOHOL 1.4 % OP SOLN: 1.0000 [drp] | OPHTHALMIC | Status: DC | PRN

## 2022-06-23 MED ORDER — PHENOL 1.4 % MT LIQD: 1.0000 | OROMUCOSAL | Status: DC | PRN

## 2022-06-23 MED ORDER — SODIUM CHLORIDE 0.9 % IV SOLN
250.0000 mL | INTRAVENOUS | Status: DC
  Administered 2022-06-23: 250 mL via INTRAVENOUS

## 2022-06-23 MED ORDER — FENTANYL CITRATE (PF) 250 MCG/5ML IJ SOLN
INTRAMUSCULAR | Status: AC
  Filled 2022-06-23: qty 5

## 2022-06-23 MED ORDER — 0.9 % SODIUM CHLORIDE (POUR BTL) OPTIME
TOPICAL | Status: DC | PRN
  Administered 2022-06-23: 2000 mL

## 2022-06-23 MED ORDER — BLISTEX MEDICATED EX OINT: TOPICAL_OINTMENT | CUTANEOUS | Status: DC | PRN

## 2022-06-23 MED ORDER — SUGAMMADEX SODIUM 200 MG/2ML IV SOLN
INTRAVENOUS | Status: DC | PRN
  Administered 2022-06-23: 200 mg via INTRAVENOUS

## 2022-06-23 MED ORDER — GLUCOSAMINE-CHONDROITIN 500-400 MG PO CAPS: ORAL_CAPSULE | Freq: Every day | ORAL | Status: DC

## 2022-06-23 MED ORDER — ONDANSETRON HCL 4 MG/2ML IJ SOLN
INTRAMUSCULAR | Status: DC | PRN
  Administered 2022-06-23: 4 mg via INTRAVENOUS

## 2022-06-23 MED ORDER — PHENYLEPHRINE 80 MCG/ML (10ML) SYRINGE FOR IV PUSH (FOR BLOOD PRESSURE SUPPORT)
PREFILLED_SYRINGE | INTRAVENOUS | Status: DC | PRN
  Administered 2022-06-23 (×2): 160 ug via INTRAVENOUS

## 2022-06-23 MED ORDER — ROCURONIUM BROMIDE 10 MG/ML (PF) SYRINGE
PREFILLED_SYRINGE | INTRAVENOUS | Status: DC | PRN
  Administered 2022-06-23: 20 mg via INTRAVENOUS
  Administered 2022-06-23: 10 mg via INTRAVENOUS
  Administered 2022-06-23: 70 mg via INTRAVENOUS

## 2022-06-23 MED ORDER — HYDROMORPHONE HCL 1 MG/ML IJ SOLN
0.2500 mg | INTRAMUSCULAR | Status: DC | PRN
  Administered 2022-06-23: 0.5 mg via INTRAVENOUS

## 2022-06-23 MED ORDER — DEXMEDETOMIDINE HCL IN NACL 200 MCG/50ML IV SOLN
INTRAVENOUS | Status: DC | PRN
  Administered 2022-06-23 (×2): 10 ug via INTRAVENOUS

## 2022-06-23 MED ORDER — KRILL OIL 350 MG PO CAPS: 350.0000 mg | ORAL_CAPSULE | Freq: Every day | ORAL | Status: DC

## 2022-06-23 MED ORDER — MIDAZOLAM HCL 2 MG/2ML IJ SOLN
INTRAMUSCULAR | Status: DC | PRN
  Administered 2022-06-23: 2 mg via INTRAVENOUS

## 2022-06-23 MED ORDER — THROMBIN (RECOMBINANT) 5000 UNITS EX SOLR
CUTANEOUS | Status: DC | PRN
  Administered 2022-06-23: 10 mL via TOPICAL

## 2022-06-23 MED ORDER — LIDOCAINE 2% (20 MG/ML) 5 ML SYRINGE
INTRAMUSCULAR | Status: AC
  Filled 2022-06-23: qty 5

## 2022-06-23 MED ORDER — PROPOFOL 10 MG/ML IV BOLUS
INTRAVENOUS | Status: AC
  Filled 2022-06-23: qty 20

## 2022-06-23 MED ORDER — ALUM & MAG HYDROXIDE-SIMETH 200-200-20 MG/5ML PO SUSP: 30.0000 mL | Freq: Four times a day (QID) | ORAL | Status: DC | PRN

## 2022-06-23 MED ORDER — ONDANSETRON HCL 4 MG/2ML IJ SOLN
INTRAMUSCULAR | Status: AC
  Filled 2022-06-23: qty 2

## 2022-06-23 MED ORDER — THROMBIN 5000 UNITS EX SOLR
OROMUCOSAL | Status: DC | PRN
  Administered 2022-06-23 (×2): 5 mL via TOPICAL

## 2022-06-23 MED ORDER — VITAMIN E 45 MG (100 UNIT) PO CAPS: 400.0000 [IU] | ORAL_CAPSULE | Freq: Every day | ORAL | Status: DC

## 2022-06-23 SURGICAL SUPPLY — 63 items
ADH SKN CLS APL DERMABOND .7 (GAUZE/BANDAGES/DRESSINGS) ×1
APL SKNCLS STERI-STRIP NONHPOA (GAUZE/BANDAGES/DRESSINGS) ×1
BAG COUNTER SPONGE SURGICOUNT (BAG) ×1 IMPLANT
BAG SPNG CNTER NS LX DISP (BAG) ×1
BAND INSRT 18 STRL LF DISP RB (MISCELLANEOUS) ×2
BAND RUBBER #18 3X1/16 STRL (MISCELLANEOUS) ×2 IMPLANT
BASKET BONE COLLECTION (BASKET) ×1 IMPLANT
BENZOIN TINCTURE PRP APPL 2/3 (GAUZE/BANDAGES/DRESSINGS) ×1 IMPLANT
BIT DRILL NEURO 2X3.1 SFT TUCH (MISCELLANEOUS) ×1 IMPLANT
BONE VIVIGEN FORMABLE 1.3CC (Bone Implant) ×1 IMPLANT
BUR CROSS CUT FISSURE 1.2 (BURR) IMPLANT
BUR MATCHSTICK NEURO 3.0 LAGG (BURR) ×1 IMPLANT
CANISTER SUCT 3000ML PPV (MISCELLANEOUS) ×1 IMPLANT
DERMABOND ADVANCED .7 DNX12 (GAUZE/BANDAGES/DRESSINGS) IMPLANT
DRAPE C-ARM 42X72 X-RAY (DRAPES) ×2 IMPLANT
DRAPE LAPAROTOMY 100X72 PEDS (DRAPES) ×1 IMPLANT
DRAPE MICROSCOPE SLANT 54X150 (MISCELLANEOUS) ×1 IMPLANT
DRILL NEURO 2X3.1 SOFT TOUCH (MISCELLANEOUS) ×1
DRSG OPSITE POSTOP 4X6 (GAUZE/BANDAGES/DRESSINGS) IMPLANT
DURAPREP 6ML APPLICATOR 50/CS (WOUND CARE) ×1 IMPLANT
ELECT COATED BLADE 2.86 ST (ELECTRODE) ×1 IMPLANT
ELECT REM PT RETURN 9FT ADLT (ELECTROSURGICAL) ×1
ELECTRODE REM PT RTRN 9FT ADLT (ELECTROSURGICAL) ×1 IMPLANT
GAUZE 4X4 16PLY ~~LOC~~+RFID DBL (SPONGE) IMPLANT
GAUZE SPONGE 4X4 12PLY STRL (GAUZE/BANDAGES/DRESSINGS) ×1 IMPLANT
GLOVE BIO SURGEON STRL SZ7 (GLOVE) IMPLANT
GLOVE BIO SURGEON STRL SZ8 (GLOVE) ×1 IMPLANT
GLOVE BIOGEL PI IND STRL 7.0 (GLOVE) IMPLANT
GLOVE EXAM NITRILE XL STR (GLOVE) IMPLANT
GLOVE INDICATOR 8.5 STRL (GLOVE) ×2 IMPLANT
GOWN STRL REUS W/ TWL LRG LVL3 (GOWN DISPOSABLE) ×1 IMPLANT
GOWN STRL REUS W/ TWL XL LVL3 (GOWN DISPOSABLE) ×1 IMPLANT
GOWN STRL REUS W/TWL 2XL LVL3 (GOWN DISPOSABLE) ×1 IMPLANT
GOWN STRL REUS W/TWL LRG LVL3 (GOWN DISPOSABLE) ×1
GOWN STRL REUS W/TWL XL LVL3 (GOWN DISPOSABLE) ×2
GRAFT BNE MATRIX VG FRMBL SM 1 (Bone Implant) IMPLANT
HALTER HD/CHIN CERV TRACTION D (MISCELLANEOUS) ×1 IMPLANT
HEMOSTAT POWDER KIT SURGIFOAM (HEMOSTASIS) ×1 IMPLANT
KIT BASIN OR (CUSTOM PROCEDURE TRAY) ×1 IMPLANT
KIT TURNOVER KIT B (KITS) ×1 IMPLANT
NDL HYPO 18GX1.5 BLUNT FILL (NEEDLE) ×1 IMPLANT
NDL SPNL 20GX3.5 QUINCKE YW (NEEDLE) ×1 IMPLANT
NEEDLE HYPO 18GX1.5 BLUNT FILL (NEEDLE) ×1 IMPLANT
NEEDLE SPNL 20GX3.5 QUINCKE YW (NEEDLE) ×1 IMPLANT
NS IRRIG 1000ML POUR BTL (IV SOLUTION) ×1 IMPLANT
PACK LAMINECTOMY NEURO (CUSTOM PROCEDURE TRAY) ×1 IMPLANT
PAD ARMBOARD 7.5X6 YLW CONV (MISCELLANEOUS) ×3 IMPLANT
PIN DISTRACTION 14MM (PIN) IMPLANT
PLATE ANT CERV XTEND 2 LV 32 (Plate) IMPLANT
RASP 3.0MM (RASP) IMPLANT
SCREW VAR 4.2 XD SELF DRILL 14 (Screw) IMPLANT
SCREW XTEND SELF DRILL 4.6X14 (Screw) IMPLANT
SPACER HEDRON 14X16X7 0D (Spacer) IMPLANT
SPIKE FLUID TRANSFER (MISCELLANEOUS) ×1 IMPLANT
SPONGE INTESTINAL PEANUT (DISPOSABLE) ×1 IMPLANT
SPONGE SURGIFOAM ABS GEL SZ50 (HEMOSTASIS) ×1 IMPLANT
STRIP CLOSURE SKIN 1/2X4 (GAUZE/BANDAGES/DRESSINGS) ×1 IMPLANT
SUT VIC AB 3-0 SH 8-18 (SUTURE) ×1 IMPLANT
SUT VICRYL 4-0 PS2 18IN ABS (SUTURE) ×1 IMPLANT
TAPE CLOTH 4X10 WHT NS (GAUZE/BANDAGES/DRESSINGS) ×1 IMPLANT
TOWEL GREEN STERILE (TOWEL DISPOSABLE) ×1 IMPLANT
TOWEL GREEN STERILE FF (TOWEL DISPOSABLE) ×1 IMPLANT
WATER STERILE IRR 1000ML POUR (IV SOLUTION) ×1 IMPLANT

## 2022-06-23 NOTE — Anesthesia Postprocedure Evaluation (Signed)
Anesthesia Post Note  Patient: Melissa Patton  Procedure(s) Performed: Anterior Cervical Decompression Fusion  - Cervical six -Cervical seven with exploration fusion removal partial  hardware Cervical three-Cervical six     Patient location during evaluation: PACU Anesthesia Type: General Level of consciousness: awake and alert Pain management: pain level controlled Vital Signs Assessment: post-procedure vital signs reviewed and stable Respiratory status: spontaneous breathing, nonlabored ventilation, respiratory function stable and patient connected to nasal cannula oxygen Cardiovascular status: blood pressure returned to baseline and stable Postop Assessment: no apparent nausea or vomiting Anesthetic complications: no  No notable events documented.  Last Vitals:  Vitals:   06/23/22 1315 06/23/22 1330  BP: 131/78 123/73  Pulse: 72 67  Resp: 19 13  Temp:  37.1 C  SpO2: 97% 94%    Last Pain:  Vitals:   06/23/22 1354  TempSrc: Oral  PainSc:                  [name]

## 2022-06-23 NOTE — Progress Notes (Signed)
Orthopedic Tech Progress Note Patient Details:  Melissa Patton 02-20-1956 [id number]  Ortho Devices Type of Ortho Device: Soft collar Ortho Device/Splint Location: NECK Ortho Device/Splint Interventions: Ordered   Post Interventions Patient Tolerated: Well Instructions Provided: Care of device  Donald Pore 06/23/2022, 2:08 PM

## 2022-06-23 NOTE — Op Note (Signed)
Preoperative diagnosis: Cervical spondylosis with stenosis and radiculopathy C6-7.  Postoperative diagnosis: Same.  Procedure: Anterior cervical discectomy and fusion at C6-7 utilizing the globus titanium cages packed with locally harvested autograft mixed with Vivigen.  Removal hardware with cutting the plate above the C5 screws exploration of fusion which appeared solid.  Surgeon: Donalee Citrin.  Assistant: Julien Girt.  Anesthesia: General.  EBL: Minimal.  HPI: 66 year old female with longstanding neck pain bilateral shoulder and arm pain workup revealed progressive spondylosis with segmental degeneration below her fusion C3-C6 with question of possible pseudoarthrosis at C5-6.  Due to patient's progression of clinical syndrome imaging findings of a conservative treatment I recommended anterior cervical discectomy and fusion at C6-7 with exploration fusion at C5-6 with removal of hardware.  I extensively went over the risks and benefits of the operation with the patient as well as perioperative course expectations of outcome and alternatives of surgery and she understood and agreed to proceed forward.  Operative procedure: Patient was brought into the OR was induced under general anesthesia positioned supine the neck in slight extension 5 pounds halter traction.  The right side of her neck was prepped and draped in routine sterile fashion.  Preoperative x-ray localized the appropriate level.  After localization a curvilinear incision was made just off the midline to the entry border of the sternocleidomastoid and the superficial layer of the platysma was dissected and divided longitudinally.  The avascular plane between the sternomastoid and strap muscle was developed down to the prevertebral fascia and prevertebral fascia was dissected with Kitners.  The plate was immediately identified this was dissected free longus was reflected laterally and self-retaining retractor was placed.  Disc base below  the plate was incised anterior osteophytes were bitten off with a 3 Miller Kerrison punch disc base was drilled down capturing the bone shavings and mucus trap.  Then under microscopic illumination further drilling down the posterior annulus and osteophytic complex allowed identification of the posterior logical ligament which was removed in piecemeal fashion.  Large posterior spurring as well as severe right-sided uncinate hypertrophy was all identified this was all removed decompressing the C7 nerve roots bilaterally but especially on the right skeletonizing and flush with the pedicle.  Then at the end discectomy there is no further stenosis I sized up a 7 mm 0 degree titanium cage packed with locally harvested autograft mixed with Vivigen and inserted it.  I then further dissected the plate out cut the plate the level of the C5 screws and remove the inferior aspect the plate.  I then contoured a 32 mm globus extend plate utilized the previous screw holes at C5 and C6 and drilled screw new holes 7 postop imaging confirmed good position of the implants.  The plate was all anchored screws were locked in place wound was copiously irrigated meticulous hemostasis was maintained and plaques and additional bone graft underneath the plate and laterally to the cage at C6-7 prior to plate placement.  Then the wound was closed in layers with active Vicryl and a running 4 subcuticular.  Dermabond benzoin Steri-Strips and a 4 x 4 was applied recovery in stable condition.  At the end the case all needle count sponge counts were correct.

## 2022-06-23 NOTE — Anesthesia Preprocedure Evaluation (Addendum)
Anesthesia Evaluation  Patient identified by MRN, date of birth, ID band Patient awake    Reviewed: Allergy & Precautions, H&P , NPO status , Patient's Chart, lab work & pertinent test results  Airway Mallampati: II  TM Distance: >3 FB Neck ROM: Limited    Dental  (+) Upper Dentures, Dental Advisory Given   Pulmonary former smoker   Pulmonary exam normal breath sounds clear to auscultation       Cardiovascular hypertension, Pt. on medications Normal cardiovascular exam Rhythm:Regular Rate:Normal     Neuro/Psych negative neurological ROS  negative psych ROS   GI/Hepatic Neg liver ROS,GERD  ,,  Endo/Other  diabetes, Type 2    Renal/GU negative Renal ROS  negative genitourinary   Musculoskeletal negative musculoskeletal ROS (+)    Abdominal   Peds negative pediatric ROS (+)  Hematology negative hematology ROS (+)   Anesthesia Other Findings   Reproductive/Obstetrics negative OB ROS                             Anesthesia Physical Anesthesia Plan  ASA: 2  Anesthesia Plan: General   Post-op Pain Management: Ofirmev IV (intra-op)*   Induction: Intravenous  PONV Risk Score and Plan: 3 and Ondansetron, Dexamethasone and Treatment may vary due to age or medical condition  Airway Management Planned: Oral ETT  Additional Equipment:   Intra-op Plan:   Post-operative Plan: Extubation in OR  Informed Consent: I have reviewed the patients History and Physical, chart, labs and discussed the procedure including the risks, benefits and alternatives for the proposed anesthesia with the patient or authorized representative who has indicated his/her understanding and acceptance.     Dental advisory given  Plan Discussed with: CRNA and Surgeon  Anesthesia Plan Comments:        Anesthesia Quick Evaluation

## 2022-06-23 NOTE — Transfer of Care (Signed)
Immediate Anesthesia Transfer of Care Note  Patient: Melissa Patton  Procedure(s) Performed: Anterior Cervical Decompression Fusion  - Cervical six -Cervical seven with exploration fusion removal partial  hardware Cervical three-Cervical six  Patient Location: PACU  Anesthesia Type:General  Level of Consciousness: drowsy and patient cooperative  Airway & Oxygen Therapy: Patient Spontanous Breathing and Patient connected to nasal cannula oxygen  Post-op Assessment: Report given to RN and Post -op Vital signs reviewed and stable  Post vital signs: stable  Last Vitals:  Vitals Value Taken Time  BP 145/82 06/23/22 1230  Temp    Pulse 85 06/23/22 1235  Resp 11 06/23/22 1235  SpO2 98 % 06/23/22 1235  Vitals shown include unvalidated device data.  Last Pain:  Vitals:   06/23/22 0835  TempSrc:   PainSc: 0-No pain         Complications: No notable events documented.

## 2022-06-23 NOTE — H&P (Signed)
Melissa Patton is an 66 y.o. female.   Chief Complaint: Neck pain C7 radicular symptoms HPI: 66 year old female previously had undergone a C3-C6 anterior cervical discectomy and fusion.  Over the last several weeks to months has had progressive worsening neck pain and shoulder and arm pain rating down the first 2 fingers of her hand consistent with a C7 radicular pattern.  Workup revealed progressive spondylosis and segmental degeneration below her fusion at C6-7.  Due to patient progression of clinical syndrome imaging findings of a conservative treatment I recommended anterior cervical discectomy and fusion at C6-7.  I extensively reviewed the risks and benefits of the operation with her as well as perioperative course expectations of outcome and alternatives of surgery and she understood and agreed to proceed forward.  Preoperative CT also showed question of pseudoarthrosis at C5-6 we will exam and examine this if it looks like it is incompletely fused we will cut the plate above this and potentially drill out the cage and back some additional bone around it.  Past Medical History:  Diagnosis Date   Arthritis    Chicken pox    Chronic neck pain    DDD/spondylosis/stenosis   Dry eyes    dry eyes d/t use of contacts   GERD (gastroesophageal reflux disease)    takes omeprazole as needed per pt   Hyperlipidemia    takes Atorvastatin daily   Hypertension    IBS (irritable bowel syndrome)    Insomnia    Nocturia    Urinary frequency    Urinary incontinence    Urinary urgency     Past Surgical History:  Procedure Laterality Date   ABDOMINAL HYSTERECTOMY     30 years ago   ANTERIOR CERVICAL DECOMP/DISCECTOMY FUSION  05/17/2012   Procedure: ANTERIOR CERVICAL DECOMPRESSION/DISCECTOMY FUSION 3 LEVELS;  Surgeon: Elaina Hoops, MD;  Location: [facility];  Service: Neurosurgery;  Laterality: Bilateral;  Cervical three-four, four-five, five-six anterior cervical discectomy with fusion   back  pain     hx of buldging disc   BACK SURGERY     x 2   CARPAL TUNNEL RELEASE     COLONOSCOPY  17 years ago   COLONOSCOPY WITH PROPOFOL N/A 05/22/2015   Procedure: COLONOSCOPY WITH PROPOFOL;  Surgeon: Robert Bellow, MD;  Location: [facility];  Service: Endoscopy;  Laterality: N/A;   ESOPHAGOGASTRODUODENOSCOPY     FOOT SURGERY     d/t hammer toe-bil   hammer toe     LUMBAR LAMINECTOMY  1989, 1991   [place], Dr. Rolin Barry   SHOULDER ARTHROSCOPY Right 01/2013   Dr. Justice Britain - [facility]   TUBAL LIGATION     VAGINAL DELIVERY      Family History  Problem Relation Age of Onset   Heart disease Mother    Diabetes Mother    Heart disease Father    Diabetes Father    Diabetes Brother    Depression Brother    Breast cancer Neg Hx    Social History:  reports that she has quit smoking. She has never used smokeless tobacco. She reports that she does not drink alcohol and does not use drugs.  Allergies:  Allergies  Allergen Reactions   Hydrocodone Itching   Tramadol Itching   Oxycodone Itching    Medications Prior to Admission  Medication Sig Dispense Refill   amLODipine (NORVASC) 5 MG tablet Take 5 mg by mouth daily after breakfast.     Biotin w/ Vitamins C & E (  HAIR SKIN & NAILS GUMMIES PO) Take 1 tablet by mouth daily after breakfast.     Cholecalciferol (VITAMIN D3) 125 MCG (5000 UT) TABS Take 5,000 Units by mouth daily after breakfast.     Docusate Sodium (STOOL SOFTENER) 100 MG capsule Take 100 mg by mouth 2 (two) times a week.     fluticasone (FLONASE) 50 MCG/ACT nasal spray Place 1 spray into both nostrils in the morning and at bedtime.     Glucosamine-Chondroitin (COSAMIN DS PO) Take 1 tablet by mouth daily after breakfast.     Multiple Vitamins-Minerals (ADULT ONE DAILY GUMMIES PO) Take 1 tablet by mouth daily after breakfast.     Naphazoline-Glycerin-Zinc Sulf (CLEAR EYES MAXIMUM ITCHY EYE) 0.012-0.25-0.25 % SOLN Place 1 drop into both eyes at bedtime.      omeprazole (PRILOSEC) 20 MG capsule Take 20 mg by mouth daily.     oxybutynin (DITROPAN-XL) 5 MG 24 hr tablet Take 5 mg by mouth in the morning.     Skin Protectants, Misc. (LIP BALM EX) Apply 1 Application topically as needed (lip dryness/irritation.).     vitamin E 180 MG (400 UNITS) capsule Take 400 Units by mouth daily after breakfast.     blood glucose meter kit and supplies KIT Dispense based on patient and insurance preference. Check blood sugar once daily fasting. ICD 10 code E11.9. 1 each 0   Krill Oil 350 MG CAPS Take 350 mg by mouth daily after breakfast.     triamcinolone cream (KENALOG) 0.1 % Apply 1 Application topically 2 (two) times daily as needed (cyst near neck/back).      Results for orders placed or performed during the hospital encounter of 06/23/22 (from the past 48 hour(s))  Type and screen [place] [facility]     Status: None   Collection Time: 06/23/22  8:30 AM  Result Value Ref Range   ABO/RH(D) O POS    Antibody Screen NEG    Sample Expiration      06/26/2022,2359 Performed at [facility], [place] [address], [place], [place] [address]    No results found.  Review of Systems  Musculoskeletal:  Positive for neck pain.  Neurological:  Positive for numbness.    Blood pressure (!) 145/70, pulse 73, temperature 98.6 F (37 C), temperature source Oral, resp. rate 18, height 5' 6" (1.676 m), weight 63.5 kg, SpO2 100 %. Physical Exam HENT:     Head: Normocephalic.     Right Ear: Tympanic membrane normal.     Nose: Nose normal.     Mouth/Throat:     Mouth: Mucous membranes are moist.  Eyes:     Pupils: Pupils are equal, round, and reactive to light.  Cardiovascular:     Rate and Rhythm: Normal rate.  Pulmonary:     Effort: Pulmonary effort is normal.  Abdominal:     General: Abdomen is flat.  Musculoskeletal:        General: Normal range of motion.  Skin:    General: Skin is warm.  Neurological:     Mental Status: She is alert.      Comments: Strength is 5-5 12, bicep, tricep, wrist flexion, wrist extension, hand intrinsics.      Assessment/Plan 66 year old presents for ACDF C6-7 removal hardware C5-6.  Elaina Hoops, MD 06/23/2022, 9:42 AM

## 2022-06-24 ENCOUNTER — Encounter (HOSPITAL_COMMUNITY): Payer: Self-pay | Admitting: Neurosurgery

## 2022-06-24 LAB — MRSA NEXT GEN BY PCR, NASAL: MRSA by PCR Next Gen: NEGATIVE — AB

## 2022-06-24 MED ORDER — CYCLOBENZAPRINE HCL 10 MG PO TABS: 10.0000 mg | ORAL_TABLET | Freq: Three times a day (TID) | ORAL | 0 refills | Status: AC | PRN

## 2022-06-24 MED ORDER — HYDROCODONE-ACETAMINOPHEN 5-325 MG PO TABS: 1.0000 | ORAL_TABLET | Freq: Four times a day (QID) | ORAL | 0 refills | Status: AC | PRN

## 2022-06-24 MED FILL — Thrombin For Soln 5000 Unit: CUTANEOUS | Qty: 5000 | Status: AC

## 2022-06-24 MED FILL — Thrombin For Soln 5000 Unit: CUTANEOUS | Qty: 2 | Status: AC

## 2022-06-24 NOTE — Discharge Summary (Signed)
Physician Discharge Summary  Patient ID: Melissa Patton MRN: [id number] DOB/AGE: [birth year] 66 y.o.  Admit date: 06/23/2022 Discharge date: 06/24/2022  Admission Diagnoses:  Cervical spondylosis with stenosis and radiculopathy C6-7.     Discharge Diagnoses: same   Discharged Condition: good  Hospital Course: The patient was admitted on 06/23/2022 and taken to the operating room where the patient underwent acdf C6-7. The patient tolerated the procedure well and was taken to the recovery room and then to the floor in stable condition. The hospital course was routine. There were no complications. The wound remained clean dry and intact. Pt had appropriate neck soreness. No complaints of arm pain or new N/T/W. The patient remained afebrile with stable vital signs, and tolerated a regular diet. The patient continued to increase activities, and pain was well controlled with oral pain medications.   Consults: None  Significant Diagnostic Studies:  Results for orders placed or performed during the hospital encounter of 06/23/22  Type and screen [place]  Result Value Ref Range   ABO/RH(D) O POS    Antibody Screen NEG    Sample Expiration      06/26/2022,2359 Performed at [facility], [place] [address], [place], [place] [address]     DG Cervical Spine 1 View  Result Date: 06/23/2022 CLINICAL DATA:  Anterior cervical decompression fusion. EXAM: DG CERVICAL SPINE - 1 VIEW; DG C-ARM 1-60 MIN-NO REPORT COMPARISON:  None Available. FINDINGS: Single fluoroscopic image shows anterior cervical fusion hardware extending from the C3 level to most likely the C7 vertebral body level, extending the earlier hardware, and with new graft/spacer at the lowest level seen. Hardware appears appropriately positioned. Osseous alignment appears anatomic. Fluoroscopy provided for 0 minutes, 4 seconds. 0.40 mGy IMPRESSION: Intraoperative fluoroscopic images demonstrating anterior cervical  fusion hardware extending from the C3 level to most likely the C7 vertebral body level, with new graft/spacer at the lowest level seen. Electronically Signed   By: Franki Cabot M.D.   On: 06/23/2022 12:55   DG C-Arm 1-60 Min-No Report  Result Date: 06/23/2022 CLINICAL DATA:  Anterior cervical decompression fusion. EXAM: DG CERVICAL SPINE - 1 VIEW; DG C-ARM 1-60 MIN-NO REPORT COMPARISON:  None Available. FINDINGS: Single fluoroscopic image shows anterior cervical fusion hardware extending from the C3 level to most likely the C7 vertebral body level, extending the earlier hardware, and with new graft/spacer at the lowest level seen. Hardware appears appropriately positioned. Osseous alignment appears anatomic. Fluoroscopy provided for 0 minutes, 4 seconds. 0.40 mGy IMPRESSION: Intraoperative fluoroscopic images demonstrating anterior cervical fusion hardware extending from the C3 level to most likely the C7 vertebral body level, with new graft/spacer at the lowest level seen. Electronically Signed   By: Franki Cabot M.D.   On: 06/23/2022 12:55    Antibiotics:  Anti-infectives (From admission, onward)    Start     Dose/Rate Route Frequency Ordered Stop   06/23/22 2000  ceFAZolin (ANCEF) IVPB 2g/100 mL premix        2 g 200 mL/hr over 30 Minutes Intravenous Every 8 hours 06/23/22 1316 06/24/22 0339   06/23/22 0949  ceFAZolin (ANCEF) 2-4 GM/100ML-% IVPB       Note to Pharmacy: Alba Cory B: cabinet override      06/23/22 0949 06/23/22 1932       Discharge Exam: Blood pressure 125/77, pulse 84, temperature 98 F (36.7 C), temperature source Oral, resp. rate 16, height _0  (1.676 m), weight 63.5 kg, SpO2 99 %. Neurologic:  Grossly normal Ambulating and voiding well incision cdi   Discharge Medications:   Allergies as of 06/24/2022       Reactions   Hydrocodone Itching   Tramadol Itching   Oxycodone Itching        Medication List     TAKE these medications    ADULT ONE DAILY  GUMMIES PO Take 1 tablet by mouth daily after breakfast.   amLODipine 5 MG tablet Commonly known as: NORVASC Take 5 mg by mouth daily after breakfast.   blood glucose meter kit and supplies Kit Dispense based on patient and insurance preference. Check blood sugar once daily fasting. ICD 10 code E11.9.   Clear Eyes Maximum Itchy Eye 0.012-0.25-0.25 % Soln Generic drug: Naphazoline-Glycerin-Zinc Sulf Place 1 drop into both eyes at bedtime.   COSAMIN DS PO Take 1 tablet by mouth daily after breakfast.   cyclobenzaprine 10 MG tablet Commonly known as: FLEXERIL Take 1 tablet (10 mg total) by mouth 3 (three) times daily as needed for muscle spasms.   fluticasone 50 MCG/ACT nasal spray Commonly known as: FLONASE Place 1 spray into both nostrils in the morning and at bedtime.   HAIR SKIN & NAILS GUMMIES PO Take 1 tablet by mouth daily after breakfast.   HYDROcodone-acetaminophen 5-325 MG tablet Commonly known as: NORCO/VICODIN Take 1 tablet by mouth every 6 (six) hours as needed for severe pain ((score 7 to 10)).   Krill Oil 350 MG Caps Take 350 mg by mouth daily after breakfast.   LIP BALM EX Apply 1 Application topically as needed (lip dryness/irritation.).   omeprazole 20 MG capsule Commonly known as: PRILOSEC Take 20 mg by mouth daily.   oxybutynin 5 MG 24 hr tablet Commonly known as: DITROPAN-XL Take 5 mg by mouth in the morning.   Stool Softener 100 MG capsule Generic drug: Docusate Sodium Take 100 mg by mouth 2 (two) times a week.   triamcinolone cream 0.1 % Commonly known as: KENALOG Apply 1 Application topically 2 (two) times daily as needed (cyst near neck/back).   Vitamin D3 125 MCG (5000 UT) Tabs Take 5,000 Units by mouth daily after breakfast.   vitamin E 180 MG (400 UNITS) capsule Take 400 Units by mouth daily after breakfast.        Disposition: home   Final Dx: acdf C6-7  Discharge Instructions      Remove dressing in 72 hours   Complete  by: As directed    Call MD for:  difficulty breathing, headache or visual disturbances   Complete by: As directed    Call MD for:  hives   Complete by: As directed    Call MD for:  persistant dizziness or light-headedness   Complete by: As directed    Call MD for:  persistant nausea and vomiting   Complete by: As directed    Call MD for:  redness, tenderness, or signs of infection (pain, swelling, redness, odor or green/yellow discharge around incision site)   Complete by: As directed    Call MD for:  severe uncontrolled pain   Complete by: As directed    Call MD for:  temperature >100.4   Complete by: As directed    Diet - low sodium heart healthy   Complete by: As directed    Driving Restrictions   Complete by: As directed    No driving for 2 weeks, no riding in the car for 1 week   Increase activity slowly   Complete by: As directed  Lifting restrictions   Complete by: As directed    No lifting more than 8 lbs          Signed: Ocie Cornfield Antaeus Karel 06/24/2022, 8:01 AM

## 2022-06-24 NOTE — Progress Notes (Signed)
Patient alert and oriented, mae's well, voiding adequate amount of urine, swallowing without difficulty, no c/o pain at time of discharge. Patient discharged home with family. Script and discharged instructions given to patient. Patient and family stated understanding of instructions given. Patient has an appointment with Dr. Cram 

## 2022-06-24 NOTE — Progress Notes (Signed)
PT Cancellation Note and Discharge  Patient Details Name: Melissa Patton MRN: [id number] DOB: Sep 05, 1955   Cancelled Treatment:    Reason Eval/Treat Not Completed: PT screened, no needs identified, will sign off. Discussed pt case with OT who reports pt is currently mobilizing at an independent level and does not require a formal PT evaluation at this time. PT signing off. If needs change, please reconsult.     Marylynn Pearson 06/24/2022, 8:35 AM  Conni Slipper, PT, DPT Acute Rehabilitation Services Secure Chat Preferred Office: [phone]

## 2022-06-24 NOTE — Evaluation (Signed)
Occupational Therapy Evaluation and Discharge Patient Details Name: Melissa Patton MRN: [id number] DOB: 1955/10/30 Today's Date: 06/24/2022   History of Present Illness Melissa Patton is a 66yo female s/p anterior cervical discectomy and fusion at C6-7 and removal hardware with cutting the plate above the C5 screws and exploration of fusion; due to progressive spondylosis and segmental degeneration.   Clinical Impression   This 66 yo female s/p above presents to acute OT with all education completed, no further OT needs and no skilled PT needs identified. OT will sign off.      Recommendations for follow up therapy are one component of a multi-disciplinary discharge planning process, led by the attending physician.  Recommendations may be updated based on patient status, additional functional criteria and insurance authorization.   Follow Up Recommendations  No OT follow up     Assistance Recommended at Discharge PRN  Patient can return home with the following Assist for transportation;Assistance with cooking/housework    Functional Status Assessment  Patient has had a recent decline in their functional status and demonstrates the ability to make significant improvements in function in a reasonable and predictable amount of time. (without further need for OT or PT sercvices)  Equipment Recommendations  None recommended by OT       Precautions / Restrictions Precautions Precautions: Cervical Precaution Booklet Issued: Yes (comment) Required Braces or Orthoses: Cervical Brace Cervical Brace: Soft collar Restrictions Weight Bearing Restrictions: No      Mobility Bed Mobility Overal bed mobility: Independent                  Transfers Overall transfer level: Independent                        Balance Overall balance assessment: Mild deficits observed, not formally tested ("stll a little off from surgery yesterday")                                          ADL either performed or assessed with clinical judgement   ADL                                         General ADL Comments: Educated on 2 cups for brushing teeth, use of straws when drinking from cups, sequence of UBD, c-collar wear and care.     Vision Patient Visual Report: No change from baseline              Pertinent Vitals/Pain Pain Assessment Pain Assessment: Faces Faces Pain Scale: Hurts a little bit Pain Location: incisional Pain Descriptors / Indicators: Sore Pain Intervention(s): Limited activity within patient's tolerance     Hand Dominance Right   Extremity/Trunk Assessment Upper Extremity Assessment Upper Extremity Assessment: Overall WFL for tasks assessed           Communication Communication Communication: No difficulties   Cognition Arousal/Alertness: Awake/alert Behavior During Therapy: WFL for tasks assessed/performed Overall Cognitive Status: Within Functional Limits for tasks assessed                                                  Home  Living Family/patient expects to be discharged to:: Private residence Living Arrangements: Spouse/significant other Available Help at Discharge: Family;Available 24 hours/day Type of Home: House Home Access: Stairs to enter [employer] of Steps: 2 Entrance Stairs-Rails: None Home Layout: One level     Bathroom Shower/Tub: Producer, television/film/video: Standard     Home Equipment: Hand held shower head;Grab bars - tub/shower          Prior Functioning/Environment Prior Level of Function : Independent/Modified Independent                        OT Problem List: Impaired balance (sitting and/or standing);Pain         OT Goals(Current goals can be found in the care plan section) Acute Rehab OT Goals Patient Stated Goal: home today         AM-PAC OT "6 Clicks" Daily Activity     Outcome Measure Help from another  person eating meals?: None Help from another person taking care of personal grooming?: None Help from another person toileting, which includes using toliet, bedpan, or urinal?: None Help from another person bathing (including washing, rinsing, drying)?: None Help from another person to put on and taking off regular upper body clothing?: None Help from another person to put on and taking off regular lower body clothing?: None 6 Click Score: 24   End of Session Equipment Utilized During Treatment: Cervical collar Nurse Communication:  (no further OT needs, and no PT needs identified)  Activity Tolerance: Patient tolerated treatment well Patient left: in bed;with call bell/phone within reach;with family/visitor present  OT Visit Diagnosis: Muscle weakness (generalized) (M62.81);Pain Pain - part of body:  (incisional)                Time: 1017-5102 OT Time Calculation (min): 20 min Charges:  OT General Charges $OT Visit: 1 Visit OT Evaluation $OT Eval Moderate Complexity: 1 Mod  Ignacia Palma, OTR/L Acute [employer] [facility] [phone] Office [phone]    Evette Georges 06/24/2022, 10:00 AM

## 2022-07-08 ENCOUNTER — Ambulatory Visit
Admission: RE | Admit: 2022-07-08 | Discharge: 2022-07-08 | Disposition: A | Discharge status: Home / Self Care | Payer: Medicare Other | Source: Ambulatory Visit | Attending: Family Medicine | Admitting: Family Medicine

## 2022-07-08 DIAGNOSIS — Z780 Asymptomatic menopausal state: Secondary | ICD-10-CM | POA: Diagnosis present

## 2022-07-08 DIAGNOSIS — Z1231 Encounter for screening mammogram for malignant neoplasm of breast: Secondary | ICD-10-CM | POA: Diagnosis present

## 2022-07-27 ENCOUNTER — Emergency Department
Admission: EM | Admit: 2022-07-27 | Discharge: 2022-07-27 | Discharge status: Against Medical Advice | Payer: Medicare Other | Attending: Student in an Organized Health Care Education/Training Program | Admitting: Student in an Organized Health Care Education/Training Program

## 2022-07-27 ENCOUNTER — Other Ambulatory Visit: Payer: Self-pay

## 2022-07-27 DIAGNOSIS — Z1152 Encounter for screening for COVID-19: Secondary | ICD-10-CM | POA: Insufficient documentation

## 2022-07-27 DIAGNOSIS — R350 Frequency of micturition: Secondary | ICD-10-CM | POA: Insufficient documentation

## 2022-07-27 DIAGNOSIS — R1030 Lower abdominal pain, unspecified: Secondary | ICD-10-CM | POA: Diagnosis not present

## 2022-07-27 DIAGNOSIS — R509 Fever, unspecified: Secondary | ICD-10-CM | POA: Diagnosis not present

## 2022-07-27 DIAGNOSIS — R519 Headache, unspecified: Secondary | ICD-10-CM | POA: Insufficient documentation

## 2022-07-27 DIAGNOSIS — Z5321 Procedure and treatment not carried out due to patient leaving prior to being seen by health care provider: Secondary | ICD-10-CM | POA: Insufficient documentation

## 2022-07-27 DIAGNOSIS — M791 Myalgia, unspecified site: Secondary | ICD-10-CM | POA: Insufficient documentation

## 2022-07-27 DIAGNOSIS — R5383 Other fatigue: Secondary | ICD-10-CM | POA: Diagnosis not present

## 2022-07-27 LAB — CBC
HCT: 40 % (ref 36.0–46.0)
Hemoglobin: 12.9 g/dL (ref 12.0–15.0)
MCH: 28.1 pg (ref 26.0–34.0)
MCHC: 32.3 g/dL (ref 30.0–36.0)
MCV: 87.1 fL (ref 80.0–100.0)
Platelets: 299 10*3/uL (ref 150–400)
RBC: 4.59 MIL/uL (ref 3.87–5.11)
RDW: 12.8 % (ref 11.5–15.5)
WBC: 12 10*3/uL — ABNORMAL HIGH (ref 4.0–10.5)
nRBC: 0 % (ref 0.0–0.2)

## 2022-07-27 LAB — LIPASE, BLOOD: Lipase: 41 U/L (ref 11–51)

## 2022-07-27 LAB — COMPREHENSIVE METABOLIC PANEL
ALT: 13 U/L (ref 0–44)
AST: 19 U/L (ref 15–41)
Albumin: 4 g/dL (ref 3.5–5.0)
Alkaline Phosphatase: 61 U/L (ref 38–126)
Anion gap: 10 (ref 5–15)
BUN: 15 mg/dL (ref 8–23)
CO2: 28 mmol/L (ref 22–32)
Calcium: 9.6 mg/dL (ref 8.9–10.3)
Chloride: 98 mmol/L (ref 98–111)
Creatinine, Ser: 0.81 mg/dL (ref 0.44–1.00)
GFR, Estimated: >60 mL/min (ref >60)
Glucose, Bld: 143 mg/dL — ABNORMAL HIGH (ref 70–99)
Potassium: 4.3 mmol/L (ref 3.5–5.1)
Sodium: 136 mmol/L (ref 135–145)
Total Bilirubin: 0.9 mg/dL (ref 0.3–1.2)
Total Protein: 8.2 g/dL — ABNORMAL HIGH (ref 6.5–8.1)

## 2022-07-27 LAB — RESP PANEL BY RT-PCR (RSV, FLU A&B, COVID)  RVPGX2
Influenza A by PCR: NEGATIVE
Influenza B by PCR: NEGATIVE
Resp Syncytial Virus by PCR: NEGATIVE
SARS Coronavirus 2 by RT PCR: NEGATIVE

## 2022-07-27 NOTE — ED Notes (Signed)
The patient informed the registration staff that she was leaving. This RN attempted to contact the phone number [phone]) listed on the chart - pt did not answer.

## 2022-07-27 NOTE — ED Triage Notes (Signed)
Pt to ED with family member POV from home for fever, body aches,HA,  lower abdominal pain, urinary frequency since 2 days. Pt is eating less and feeling fatigued. Temp was 102 today. Also having chills.Took 2 tylenol about 2 hr ago.

## 2022-11-05 ENCOUNTER — Other Ambulatory Visit: Payer: Self-pay | Admitting: Family Medicine

## 2022-11-05 DIAGNOSIS — R413 Other amnesia: Secondary | ICD-10-CM

## 2022-11-14 ENCOUNTER — Ambulatory Visit
Admission: RE | Admit: 2022-11-14 | Discharge: 2022-11-14 | Disposition: A | Discharge status: Home / Self Care | Payer: Medicare Other | Source: Ambulatory Visit | Attending: Family Medicine | Admitting: Family Medicine

## 2022-11-14 DIAGNOSIS — R413 Other amnesia: Secondary | ICD-10-CM | POA: Insufficient documentation

## 2023-05-29 ENCOUNTER — Other Ambulatory Visit: Payer: Self-pay | Admitting: Family Medicine

## 2023-05-29 DIAGNOSIS — Z1231 Encounter for screening mammogram for malignant neoplasm of breast: Secondary | ICD-10-CM

## 2023-06-25 ENCOUNTER — Other Ambulatory Visit: Payer: Self-pay | Admitting: Neurosurgery

## 2023-06-25 DIAGNOSIS — M542 Cervicalgia: Secondary | ICD-10-CM

## 2023-07-02 ENCOUNTER — Ambulatory Visit
Admission: RE | Admit: 2023-07-02 | Discharge: 2023-07-02 | Disposition: A | Discharge status: Home / Self Care | Payer: Medicare Other | Source: Ambulatory Visit | Attending: Neurosurgery | Admitting: Neurosurgery

## 2023-07-02 DIAGNOSIS — M542 Cervicalgia: Secondary | ICD-10-CM

## 2023-07-13 ENCOUNTER — Ambulatory Visit
Admission: RE | Admit: 2023-07-13 | Discharge: 2023-07-13 | Disposition: A | Discharge status: Home / Self Care | Payer: Medicare Other | Source: Ambulatory Visit | Attending: Family Medicine | Admitting: Family Medicine

## 2023-07-13 DIAGNOSIS — Z1231 Encounter for screening mammogram for malignant neoplasm of breast: Secondary | ICD-10-CM | POA: Insufficient documentation
